# Patient Record
Sex: Male | Born: 1955 | Race: White | Hispanic: No | Marital: Single | Smoking: Current every day smoker
Health system: Southern US, Community
[De-identification: ages and names within clinical notes are randomized; demographics above are authoritative.]

## PROBLEM LIST (undated history)

## (undated) ENCOUNTER — Emergency Department (HOSPITAL_COMMUNITY): Admission: EM | Discharge status: Absent without leave | Payer: Self-pay

## (undated) DIAGNOSIS — J45909 Unspecified asthma, uncomplicated: Secondary | ICD-10-CM

## (undated) DIAGNOSIS — J449 Chronic obstructive pulmonary disease, unspecified: Secondary | ICD-10-CM

## (undated) DIAGNOSIS — I10 Essential (primary) hypertension: Secondary | ICD-10-CM

## (undated) DIAGNOSIS — I219 Acute myocardial infarction, unspecified: Secondary | ICD-10-CM

## (undated) DIAGNOSIS — E119 Type 2 diabetes mellitus without complications: Secondary | ICD-10-CM

## (undated) HISTORY — PX: NECK SURGERY: SHX720

## (undated) HISTORY — PX: BACK SURGERY: SHX140

---

## 2015-06-02 ENCOUNTER — Observation Stay (HOSPITAL_COMMUNITY)
Admission: EM | Admit: 2015-06-02 | Discharge: 2015-06-03 | Disposition: A | Discharge status: Home / Self Care | Payer: Self-pay | Attending: Emergency Medicine | Admitting: Emergency Medicine

## 2015-06-02 ENCOUNTER — Emergency Department (HOSPITAL_COMMUNITY): Payer: Self-pay

## 2015-06-02 ENCOUNTER — Other Ambulatory Visit (HOSPITAL_COMMUNITY): Payer: Self-pay

## 2015-06-02 ENCOUNTER — Encounter (HOSPITAL_COMMUNITY): Payer: Self-pay | Admitting: Emergency Medicine

## 2015-06-02 ENCOUNTER — Other Ambulatory Visit: Payer: Self-pay

## 2015-06-02 DIAGNOSIS — R112 Nausea with vomiting, unspecified: Secondary | ICD-10-CM | POA: Insufficient documentation

## 2015-06-02 DIAGNOSIS — R079 Chest pain, unspecified: Principal | ICD-10-CM | POA: Diagnosis present

## 2015-06-02 DIAGNOSIS — R001 Bradycardia, unspecified: Secondary | ICD-10-CM | POA: Insufficient documentation

## 2015-06-02 DIAGNOSIS — I10 Essential (primary) hypertension: Secondary | ICD-10-CM | POA: Diagnosis present

## 2015-06-02 DIAGNOSIS — R0789 Other chest pain: Secondary | ICD-10-CM

## 2015-06-02 DIAGNOSIS — F17200 Nicotine dependence, unspecified, uncomplicated: Secondary | ICD-10-CM | POA: Insufficient documentation

## 2015-06-02 DIAGNOSIS — E876 Hypokalemia: Secondary | ICD-10-CM | POA: Diagnosis present

## 2015-06-02 DIAGNOSIS — J441 Chronic obstructive pulmonary disease with (acute) exacerbation: Secondary | ICD-10-CM | POA: Diagnosis present

## 2015-06-02 DIAGNOSIS — R062 Wheezing: Secondary | ICD-10-CM | POA: Insufficient documentation

## 2015-06-02 HISTORY — DX: Acute myocardial infarction, unspecified: I21.9

## 2015-06-02 HISTORY — DX: Essential (primary) hypertension: I10

## 2015-06-02 LAB — CBC
HEMATOCRIT: 36.6 % — AB (ref 39.0–52.0)
Hemoglobin: 11.9 g/dL — ABNORMAL LOW (ref 13.0–17.0)
MCH: 30.7 pg (ref 26.0–34.0)
MCHC: 32.5 g/dL (ref 30.0–36.0)
MCV: 94.6 fL (ref 78.0–100.0)
PLATELETS: 288 10*3/uL (ref 150–400)
RBC: 3.87 MIL/uL — ABNORMAL LOW (ref 4.22–5.81)
RDW: 12.6 % (ref 11.5–15.5)
WBC: 8.6 10*3/uL (ref 4.0–10.5)

## 2015-06-02 LAB — I-STAT TROPONIN, ED: TROPONIN I, POC: 0.01 ng/mL (ref 0.00–0.08)

## 2015-06-02 LAB — URINALYSIS, ROUTINE W REFLEX MICROSCOPIC
Glucose, UA: NEGATIVE mg/dL
HGB URINE DIPSTICK: NEGATIVE
KETONES UR: NEGATIVE mg/dL
Leukocytes, UA: NEGATIVE
NITRITE: NEGATIVE
PROTEIN: 30 mg/dL — AB
Specific Gravity, Urine: 1.028 (ref 1.005–1.030)
pH: 5.5 (ref 5.0–8.0)

## 2015-06-02 LAB — COMPREHENSIVE METABOLIC PANEL
ALK PHOS: 70 U/L (ref 38–126)
ALT: 30 U/L (ref 17–63)
AST: 31 U/L (ref 15–41)
Albumin: 3.2 g/dL — ABNORMAL LOW (ref 3.5–5.0)
Anion gap: 11 (ref 5–15)
BUN: 12 mg/dL (ref 6–20)
CALCIUM: 8.5 mg/dL — AB (ref 8.9–10.3)
CO2: 28 mmol/L (ref 22–32)
CREATININE: 0.95 mg/dL (ref 0.61–1.24)
Chloride: 99 mmol/L — ABNORMAL LOW (ref 101–111)
GFR calc non Af Amer: >60 mL/min (ref >60)
Glucose, Bld: 121 mg/dL — ABNORMAL HIGH (ref 65–99)
Potassium: 3.4 mmol/L — ABNORMAL LOW (ref 3.5–5.1)
SODIUM: 138 mmol/L (ref 135–145)
Total Bilirubin: 0.9 mg/dL (ref 0.3–1.2)
Total Protein: 6 g/dL — ABNORMAL LOW (ref 6.5–8.1)

## 2015-06-02 LAB — APTT: APTT: 30 s (ref 24–37)

## 2015-06-02 LAB — TROPONIN I: Troponin I: <0.03 ng/mL (ref <0.031)

## 2015-06-02 LAB — BASIC METABOLIC PANEL
Anion gap: 8 (ref 5–15)
BUN: 11 mg/dL (ref 6–20)
CALCIUM: 8.7 mg/dL — AB (ref 8.9–10.3)
CO2: 25 mmol/L (ref 22–32)
CREATININE: 1 mg/dL (ref 0.61–1.24)
Chloride: 106 mmol/L (ref 101–111)
GFR calc Af Amer: >60 mL/min (ref >60)
GLUCOSE: 92 mg/dL (ref 65–99)
POTASSIUM: 3.4 mmol/L — AB (ref 3.5–5.1)
SODIUM: 139 mmol/L (ref 135–145)

## 2015-06-02 LAB — PROTIME-INR
INR: 1.11 (ref 0.00–1.49)
INR: 1.13 (ref 0.00–1.49)
Prothrombin Time: 14.5 seconds (ref 11.6–15.2)
Prothrombin Time: 14.7 seconds (ref 11.6–15.2)

## 2015-06-02 LAB — LIPID PANEL
Cholesterol: 115 mg/dL (ref 0–200)
HDL: 45 mg/dL (ref >40)
LDL Cholesterol: 62 mg/dL (ref 0–99)
TRIGLYCERIDES: 38 mg/dL (ref <150)
Total CHOL/HDL Ratio: 2.6 RATIO
VLDL: 8 mg/dL (ref 0–40)

## 2015-06-02 LAB — URINE MICROSCOPIC-ADD ON
RBC / HPF: NONE SEEN RBC/hpf (ref 0–5)
WBC, UA: NONE SEEN WBC/hpf (ref 0–5)

## 2015-06-02 LAB — RAPID URINE DRUG SCREEN, HOSP PERFORMED
Amphetamines: NOT DETECTED
Barbiturates: NOT DETECTED
Benzodiazepines: NOT DETECTED
Cocaine: NOT DETECTED
OPIATES: NOT DETECTED
Tetrahydrocannabinol: NOT DETECTED

## 2015-06-02 LAB — D-DIMER, QUANTITATIVE: D-Dimer, Quant: 0.42 ug/mL-FEU (ref 0.00–0.50)

## 2015-06-02 LAB — LIPASE, BLOOD: Lipase: 36 U/L (ref 11–51)

## 2015-06-02 LAB — MAGNESIUM: MAGNESIUM: 1.9 mg/dL (ref 1.7–2.4)

## 2015-06-02 MED ORDER — ONDANSETRON HCL 4 MG/2ML IJ SOLN: 4.0000 mg | Freq: Four times a day (QID) | INTRAMUSCULAR | Status: DC | PRN

## 2015-06-02 MED ORDER — ARFORMOTEROL TARTRATE 15 MCG/2ML IN NEBU
15.0000 ug | INHALATION_SOLUTION | Freq: Two times a day (BID) | RESPIRATORY_TRACT | Status: DC
  Administered 2015-06-02 – 2015-06-03 (×3): 15 ug via RESPIRATORY_TRACT
  Filled 2015-06-02 (×3): qty 2

## 2015-06-02 MED ORDER — ALBUTEROL SULFATE (2.5 MG/3ML) 0.083% IN NEBU: 2.5000 mg | INHALATION_SOLUTION | RESPIRATORY_TRACT | Status: DC | PRN

## 2015-06-02 MED ORDER — IPRATROPIUM-ALBUTEROL 0.5-2.5 (3) MG/3ML IN SOLN
3.0000 mL | Freq: Once | RESPIRATORY_TRACT | Status: AC
  Administered 2015-06-02: 3 mL via RESPIRATORY_TRACT
  Filled 2015-06-02: qty 3

## 2015-06-02 MED ORDER — ENSURE ENLIVE PO LIQD
237.0000 mL | Freq: Two times a day (BID) | ORAL | Status: DC
  Administered 2015-06-02: 237 mL via ORAL

## 2015-06-02 MED ORDER — PREDNISONE 20 MG PO TABS
50.0000 mg | ORAL_TABLET | Freq: Every day | ORAL | Status: DC
  Administered 2015-06-03: 50 mg via ORAL
  Filled 2015-06-02 (×2): qty 2

## 2015-06-02 MED ORDER — CETYLPYRIDINIUM CHLORIDE 0.05 % MT LIQD
7.0000 mL | Freq: Two times a day (BID) | OROMUCOSAL | Status: DC
  Administered 2015-06-02 – 2015-06-03 (×3): 7 mL via OROMUCOSAL

## 2015-06-02 MED ORDER — THIAMINE HCL 100 MG/ML IJ SOLN
Freq: Once | INTRAVENOUS | Status: AC
  Administered 2015-06-02: 10:00:00 via INTRAVENOUS
  Filled 2015-06-02: qty 1000

## 2015-06-02 MED ORDER — IPRATROPIUM-ALBUTEROL 0.5-2.5 (3) MG/3ML IN SOLN
3.0000 mL | Freq: Three times a day (TID) | RESPIRATORY_TRACT | Status: DC
  Administered 2015-06-03 (×2): 3 mL via RESPIRATORY_TRACT
  Filled 2015-06-02 (×2): qty 3

## 2015-06-02 MED ORDER — POTASSIUM CHLORIDE CRYS ER 20 MEQ PO TBCR
20.0000 meq | EXTENDED_RELEASE_TABLET | Freq: Once | ORAL | Status: DC
  Filled 2015-06-02: qty 1

## 2015-06-02 MED ORDER — THIAMINE HCL 100 MG/ML IJ SOLN: 100.0000 mg | Freq: Every day | INTRAMUSCULAR | Status: DC

## 2015-06-02 MED ORDER — IBUPROFEN 800 MG PO TABS
800.0000 mg | ORAL_TABLET | Freq: Once | ORAL | Status: AC
  Administered 2015-06-02: 800 mg via ORAL
  Filled 2015-06-02: qty 1

## 2015-06-02 MED ORDER — ACETAMINOPHEN 325 MG PO TABS
650.0000 mg | ORAL_TABLET | ORAL | Status: DC | PRN
  Administered 2015-06-02 (×2): 650 mg via ORAL
  Filled 2015-06-02 (×2): qty 2

## 2015-06-02 MED ORDER — ADULT MULTIVITAMIN W/MINERALS CH
1.0000 | ORAL_TABLET | Freq: Every day | ORAL | Status: DC
  Administered 2015-06-02 – 2015-06-03 (×2): 1 via ORAL
  Filled 2015-06-02 (×2): qty 1

## 2015-06-02 MED ORDER — IPRATROPIUM-ALBUTEROL 0.5-2.5 (3) MG/3ML IN SOLN
3.0000 mL | Freq: Four times a day (QID) | RESPIRATORY_TRACT | Status: DC
  Administered 2015-06-02 (×2): 3 mL via RESPIRATORY_TRACT
  Filled 2015-06-02 (×2): qty 3

## 2015-06-02 MED ORDER — SODIUM CHLORIDE 0.9 % IV SOLN
INTRAVENOUS | Status: DC
  Administered 2015-06-02: 22:00:00 via INTRAVENOUS
  Administered 2015-06-02: 75 mL/h via INTRAVENOUS

## 2015-06-02 MED ORDER — VITAMIN B-1 100 MG PO TABS
100.0000 mg | ORAL_TABLET | Freq: Every day | ORAL | Status: DC
  Administered 2015-06-02 – 2015-06-03 (×2): 100 mg via ORAL
  Filled 2015-06-02 (×2): qty 1

## 2015-06-02 MED ORDER — ENOXAPARIN SODIUM 40 MG/0.4ML ~~LOC~~ SOLN
40.0000 mg | SUBCUTANEOUS | Status: DC
  Administered 2015-06-02 – 2015-06-03 (×2): 40 mg via SUBCUTANEOUS
  Filled 2015-06-02 (×2): qty 0.4

## 2015-06-02 MED ORDER — NICOTINE 21 MG/24HR TD PT24
21.0000 mg | MEDICATED_PATCH | Freq: Every day | TRANSDERMAL | Status: DC
  Administered 2015-06-02 – 2015-06-03 (×2): 21 mg via TRANSDERMAL
  Filled 2015-06-02 (×2): qty 1

## 2015-06-02 MED ORDER — FOLIC ACID 1 MG PO TABS
1.0000 mg | ORAL_TABLET | Freq: Every day | ORAL | Status: DC
  Administered 2015-06-02 – 2015-06-03 (×2): 1 mg via ORAL
  Filled 2015-06-02 (×2): qty 1

## 2015-06-02 MED ORDER — LORAZEPAM 2 MG/ML IJ SOLN: 1.0000 mg | Freq: Four times a day (QID) | INTRAMUSCULAR | Status: DC | PRN

## 2015-06-02 MED ORDER — ALBUTEROL SULFATE (2.5 MG/3ML) 0.083% IN NEBU
2.5000 mg | INHALATION_SOLUTION | RESPIRATORY_TRACT | Status: DC
  Administered 2015-06-02: 2.5 mg via RESPIRATORY_TRACT
  Filled 2015-06-02: qty 3

## 2015-06-02 MED ORDER — GI COCKTAIL ~~LOC~~
30.0000 mL | Freq: Four times a day (QID) | ORAL | Status: DC | PRN
Start: 2015-06-02 — End: 2015-06-03

## 2015-06-02 MED ORDER — ENSURE ENLIVE PO LIQD
237.0000 mL | Freq: Three times a day (TID) | ORAL | Status: DC
  Administered 2015-06-02 – 2015-06-03 (×4): 237 mL via ORAL

## 2015-06-02 MED ORDER — LORAZEPAM 1 MG PO TABS
1.0000 mg | ORAL_TABLET | Freq: Four times a day (QID) | ORAL | Status: DC | PRN
  Administered 2015-06-02 – 2015-06-03 (×3): 1 mg via ORAL
  Filled 2015-06-02 (×3): qty 1

## 2015-06-02 MED ORDER — ALBUTEROL SULFATE (2.5 MG/3ML) 0.083% IN NEBU: 2.5000 mg | INHALATION_SOLUTION | Freq: Four times a day (QID) | RESPIRATORY_TRACT | Status: DC | PRN

## 2015-06-02 NOTE — Progress Notes (Signed)
Initial Nutrition Assessment  DOCUMENTATION CODES:   Underweight  INTERVENTION:  Provide Ensure Enlive po TID, each supplement provides 350 kcal and 20 grams of protein Multivitamin with minerals daily   NUTRITION DIAGNOSIS:   Increased nutrient needs related to chronic illness, other (see comment) (and underweight status) as evidenced by estimated needs.   GOAL:   Patient will meet greater than or equal to 90% of their needs   MONITOR:   Supplement acceptance, Weight trends, PO intake, Labs, Skin, I & O's  REASON FOR ASSESSMENT:   Malnutrition Screening Tool    ASSESSMENT:   60 y.o. male with a past medical history significant for HTN and reported MI and smoking and alcohol use who presents with chest pain and trouble breathing for 1 day.  Pt with acute flare up of COPD per MD note. Pt asleep at time of visit. Pt appears thin with moderate muscle wasting of clavicles. Breakfast tray at bedside with 90% consumed. IV fluids with folic acid and thiamine infusing.   Labs: low hemoglobin  Diet Order:  Diet Heart Room service appropriate?: Yes; Fluid consistency:: Thin  Skin:  Reviewed, no issues  Last BM:  5/25  Height:   Ht Readings from Last 1 Encounters:  06/02/15 5\' 10"  (1.778 m)    Weight:   Wt Readings from Last 1 Encounters:  06/02/15 125 lb 8 oz (56.926 kg)    Ideal Body Weight:  75.5 kg  BMI:  Body mass index is 18.01 kg/(m^2).  Estimated Nutritional Needs:   Kcal:  2000-2200  Protein:  85-95 grams  Fluid:  2 L/day  EDUCATION NEEDS:   No education needs identified at this time  Dorothea Ogleeanne Glayds Insco RD, LDN Inpatient Clinical Dietitian Pager: [phone] After Hours Pager: [phone]

## 2015-06-02 NOTE — Clinical Social Work Note (Signed)
CSW provided patient with one pair of pants and two shirts as well as a list of multiple types of resources in [facility]. Patient appreciative.   CSW signing off. Consult if any other social work needs arise.  Charlynn CourtSarah Susannah Carbin, CSW [phone]
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
## 2015-06-02 NOTE — Plan of Care (Signed)
Problem: Consults Goal: Chest Pain Patient Education (See Patient Education module for education specifics.) Outcome: Progressing Patient admitted with chest pain during bus travel, unrelieved with sublingual nitroglycerin; associated with nausea and SOB. Initial troponin was negative, EKG showed sinus bradycardia and LVH, CXR showed changes of COPD/emphysema; patient is a heavy, every-day smoker. Patient states he had two 40 oz beers today; does not currently appear acutely intoxicated, but exhibits delayed responses and is a poor historian. Signs of alcohol abuse with some mild withdrawal symptoms; was placed on CIWA protocol per MD on admission. Initial CIWA score = 5 (see flowsheets).  Patient states he resides in [place], but is unable to provide a street address on admission to unit; CSW consult ordered for possible homeless issues and likely alcoholism.  Chest pain continues 3/10, mid chest, non-radiating with no associated symptoms.  Vitals stable; hypertensive: Filed Vitals:    06/02/15 0300 06/02/15 0315 06/02/15 0351 06/02/15 0353  BP: 157/89 147/81   154/98  Pulse: 60 52   47  Temp:       97.5 F (36.4 C)  TempSrc:          Resp: 20 15   18   Height:       5\' 10"  (1.778 m)  Weight:       56.926 kg (125 lb 8 oz)  SpO2: 98% 100% 98% 100%    Medicated with Tylenol 650 mg PO and lorazepam 1 mg PO for pain/symptoms of mild withdrawal. Discussed initial plan of care with patient and made NPO for potential diagnostic testing.  Continuing to monitor.

## 2015-06-02 NOTE — Consult Note (Signed)
CARDIOLOGY CONSULT NOTE       Patient ID: Cody Ortiz MRN: [id number] DOB/AGE: 03-23-1955 60 y.o.  Admit date: 06/02/2015 Referring Physician:  Mahala MenghiniSamtani Primary Physician: No primary care provider on file. Primary Cardiologist:  New Reason for Consultation: Chest Pain   Principal Problem:   COPD exacerbation (HCC) Active Problems:   Chest pain   Essential hypertension   Hypokalemia   HPI:  60 y.o. admitted with COPD flair and atypical chest pain The patient is a poor historian, appears to have dementia, but it seems that [place], stayed in a house with two other people where he was breathing in something that irritated his lungs, and developed pleuritic chest pain and shortness of breath and new cough. The center of his chest, pressure-like, worse with taking a breath or coughing, not associated with productive cough or hemoptysis. The pain persisted the entire drive from [place] to [place] on the bus, and when he got here he came straight to the ER via EMS. Evidently, he told EMS that the pain started at midnight and was associated with vomiting, no SOB.  [facility] Has r/o and ECG is normal Echo has been ordered and pending.    ROS All other systems reviewed and negative except as noted above  Past Medical History  Diagnosis Date  . Hypertension   . Myocardial infarction [facility](HCC)     Family History  Problem Relation Age of Onset  . Heart attack Father 1960  . Stroke Father     Social History   Social History  . Marital Status: Single    Spouse Name: N/A  . Number of Children: N/A  . Years of Education: N/A   Occupational History  . Not on file.   Social History Main Topics  . Smoking status: Current Every Day Smoker  . Smokeless tobacco: Not on file  . Alcohol Use: Yes  . Drug Use: Not on file  . Sexual Activity: Not on file   Other Topics Concern  . Not on file   Social History Narrative  . No narrative on  file    Past Surgical History  Procedure Laterality Date  . Back surgery       . antiseptic oral rinse  7 mL Mouth Rinse BID  . enoxaparin (LOVENOX) injection  40 mg Subcutaneous Q24H  . feeding supplement (ENSURE ENLIVE)  237 mL Oral BID BM  . folic acid  1 mg Oral Daily  . multivitamin with minerals  1 tablet Oral Daily  . potassium chloride  20 mEq Oral Once  . predniSONE  50 mg Oral Q breakfast  . thiamine  100 mg Oral Daily   Or  . thiamine  100 mg Intravenous Daily      Physical Exam: Blood pressure 154/98, pulse 47, temperature 97.5 F (36.4 C), temperature source Oral, resp. rate 18, height 5\' 10"  (1.778 m), weight 56.926 kg (125 lb 8 oz), SpO2 100 %.    Lethargic  Disheveled white male  HEENT: normal Neck supple with no adenopathy JVP normal no bruits no thyromegaly Lungs clear with exp wheezing and good diaphragmatic motion Heart:  S1/S2 no murmur, no rub, gallop or click PMI normal Abdomen: benighn, BS positve, no tenderness, no AAA no bruit.  No HSM or HJR Distal pulses intact with no bruits No edema Neuro non-focal Skin warm and dry No muscular weakness   Labs:   Lab Results  Component Value  Date   WBC 8.6 06/02/2015   HGB 11.9* 06/02/2015   HCT 36.6* 06/02/2015   MCV 94.6 06/02/2015   PLT 288 06/02/2015     Recent Labs Lab 06/02/15 0050  NA 139  K 3.4*  CL 106  CO2 25  BUN 11  CREATININE 1.00  CALCIUM 8.7*  GLUCOSE 92   Lab Results  Component Value Date   TROPONINI <0.03 06/02/2015    Lab Results  Component Value Date   CHOL 115 06/02/2015   Lab Results  Component Value Date   HDL 45 06/02/2015   Lab Results  Component Value Date   LDLCALC 62 06/02/2015   Lab Results  Component Value Date   TRIG 38 06/02/2015   Lab Results  Component Value Date   CHOLHDL 2.6 06/02/2015   No results found for: LDLDIRECT    Radiology: Dg Chest 2 View  06/02/2015  CLINICAL DATA:  Chest pain for 1 hour. EXAM: CHEST  2 VIEW  COMPARISON:  None. FINDINGS: The heart is normal in size, there is tortuosity of thoracic aorta. The lungs are hyperinflated. Pulmonary vasculature is normal. No consolidation, pleural effusion, or pneumothorax. Biapical pleural parenchymal scarring. No acute osseous abnormalities are seen. Remote left rib fracture. IMPRESSION: Hyperinflation, suggesting emphysema. Biapical pleural parenchymal scarring. No acute process. Electronically Signed   By: Rubye Oaks M.D.   On: 06/02/2015 01:37    Current facility-administered medications:  .  acetaminophen (TYLENOL) tablet 650 mg, 650 mg, Oral, Q4H PRN, Alberteen Sam, MD, 650 mg at 06/02/15 0354 .  albuterol (PROVENTIL) (2.5 MG/3ML) 0.083% nebulizer solution 2.5 mg, 2.5 mg, Nebulization, Q2H PRN, Alberteen Sam, MD .  antiseptic oral rinse (CPC / CETYLPYRIDINIUM CHLORIDE 0.05%) solution 7 mL, 7 mL, Mouth Rinse, BID, Rhetta Mura, MD .  enoxaparin (LOVENOX) injection 40 mg, 40 mg, Subcutaneous, Q24H, Alberteen Sam, MD .  feeding supplement (ENSURE ENLIVE) (ENSURE ENLIVE) liquid 237 mL, 237 mL, Oral, BID BM, Alberteen Sam, MD .  folic acid (FOLVITE) tablet 1 mg, 1 mg, Oral, Daily, Alberteen Sam, MD .  gi cocktail (Maalox,Lidocaine,Donnatal), 30 mL, Oral, QID PRN, Alberteen Sam, MD .  LORazepam (ATIVAN) tablet 1 mg, 1 mg, Oral, Q6H PRN, 1 mg at 06/02/15 0400 **OR** LORazepam (ATIVAN) injection 1 mg, 1 mg, Intravenous, Q6H PRN, Alberteen Sam, MD .  multivitamin with minerals tablet 1 tablet, 1 tablet, Oral, Daily, Alberteen Sam, MD .  ondansetron (ZOFRAN) injection 4 mg, 4 mg, Intravenous, Q6H PRN, Alberteen Sam, MD .  potassium chloride SA (K-DUR,KLOR-CON) CR tablet 20 mEq, 20 mEq, Oral, Once, Alberteen Sam, MD .  predniSONE (DELTASONE) tablet 50 mg, 50 mg, Oral, Q breakfast, Alberteen Sam, MD .  thiamine (VITAMIN B-1) tablet 100 mg, 100 mg, Oral, Daily **OR**  thiamine (B-1) injection 100 mg, 100 mg, Intravenous, Daily, Alberteen Sam, MD  EKG:  NSR normal ECG    ASSESSMENT AND PLAN:  Chest Pain:  Atypical in setting of COPD flair r/o and normal ECG Favor echo today and if no RWMA and remains pain free d/c in am If pain continues can consider ETT as his ECG is normal COPD:  Everyday smoker CXR with emphysema no penumonia doubt PE.  Continue steroids and inhalers  Dementia:  Not clear what living situation is like needs outpatient primary care f/u   Signed: Charlton Haws 06/02/2015, 8:08 AM

## 2015-06-02 NOTE — ED Provider Notes (Signed)
CSN: [id number]     Arrival date & time 06/02/15  0036 History   First MD Initiated Contact with Patient 06/02/15 [phone]     Chief Complaint  Patient presents with  . Chest Pain     (Consider location/radiation/quality/duration/timing/severity/associated sxs/prior Treatment) HPI   Cody Ortiz is a 60 y.o. male, with a history of Hypertension, presenting to the ED with chest pain that began suddenly about midnight this morning. Patient states he was [place] when he began to have central chest pain, 10 out of 10, describes as a tightness/squeezing, radiating to the left chest. Patient also endorses shortness of breath and nausea with 2 episodes of vomiting. Patient states he does not know what medications he is taking, but states he does not think he has any heart or lung problems. Endorses drinking 40 ounces of beer within the last 6 hours. Denies illicit drug use. Denies fever/chills, trauma, LOC, or any other complaints.    Past Medical History  Diagnosis Date  . Hypertension    No past surgical history on file. No family history on file. Social History  Substance Use Topics  . Smoking status: Current Every Day Smoker  . Smokeless tobacco: None  . Alcohol Use: None    Review of Systems  Constitutional: Positive for appetite change and unexpected weight change (About 55 lbs in last 3-6 months). Negative for fever, chills and diaphoresis.  Respiratory: Positive for shortness of breath. Negative for cough.   Cardiovascular: Positive for chest pain. Negative for palpitations and leg swelling.  Gastrointestinal: Positive for nausea and vomiting. Negative for abdominal pain, diarrhea and constipation.  Genitourinary: Negative for dysuria and flank pain.  Musculoskeletal: Negative for back pain.  Skin: Negative for color change and pallor.  Neurological: Negative for dizziness, syncope, weakness and light-headedness.  All other systems reviewed and  are negative.     Allergies  Review of patient's allergies indicates no known allergies.  Home Medications   Prior to Admission medications   Not on File   BP 137/80 mmHg  Pulse 55  Temp(Src) 97.7 F (36.5 C) (Oral)  Resp 16  SpO2 97% Physical Exam  Constitutional: He appears well-developed and well-nourished. No distress.  HENT:  Head: Normocephalic and atraumatic.  Eyes: Conjunctivae are normal. Pupils are equal, round, and reactive to light.  Neck: Neck supple.  Cardiovascular: Regular rhythm, normal heart sounds and intact distal pulses.  Bradycardia present.   Pulmonary/Chest: Effort normal. No tachypnea. He has wheezes in the right middle field, the right lower field, the left upper field, the left middle field and the left lower field. He exhibits tenderness.  Abdominal: Soft. There is no tenderness. There is no guarding.  Musculoskeletal: He exhibits no edema or tenderness.  Lymphadenopathy:    He has no cervical adenopathy.  Neurological: He is alert.  Skin: Skin is warm and dry. He is not diaphoretic.  Psychiatric: He has a normal mood and affect. His behavior is normal.  Nursing note and vitals reviewed.   ED Course  Procedures (including critical care time) Labs Review Labs Reviewed  BASIC METABOLIC PANEL - Abnormal; Notable for the following:    Potassium 3.4 (*)    Calcium 8.7 (*)    All other components within normal limits  CBC - Abnormal; Notable for the following:    RBC 3.87 (*)    Hemoglobin 11.9 (*)    HCT 36.6 (*)    All other components within normal  limits  URINE CULTURE  APTT  PROTIME-INR  D-DIMER, QUANTITATIVE (NOT AT [facility])  URINALYSIS, ROUTINE W REFLEX MICROSCOPIC (NOT AT [facility])  URINE RAPID DRUG SCREEN, HOSP PERFORMED  I-STAT TROPOININ, ED    Imaging Review Dg Chest 2 View  06/02/2015  CLINICAL DATA:  Chest pain for 1 hour. EXAM: CHEST  2 VIEW COMPARISON:  None. FINDINGS: The heart is normal in size, there is tortuosity of  thoracic aorta. The lungs are hyperinflated. Pulmonary vasculature is normal. No consolidation, pleural effusion, or pneumothorax. Biapical pleural parenchymal scarring. No acute osseous abnormalities are seen. Remote left rib fracture. IMPRESSION: Hyperinflation, suggesting emphysema. Biapical pleural parenchymal scarring. No acute process. Electronically Signed   By: Rubye Oaks M.D.   On: 06/02/2015 01:37   I have personally reviewed and evaluated these images and lab results as part of my medical decision-making.   EKG Interpretation   Date/Time:  Saturday Jun 02 2015 00:47:08 EDT Ventricular Rate:  46 PR Interval:  150 QRS Duration: 97 QT Interval:  499 QTC Calculation: 436 R Axis:   70 Text Interpretation:  Sinus bradycardia Left ventricular hypertrophy No  acute changes No significant change since last tracing Confirmed by  Rhunette Croft, MD, Janey Genta [phone]) on 06/02/2015 1:09:39 AM      MDM   Final diagnoses:  Chest pain, unspecified chest pain type    Cody Jenkins presents with sudden onset of chest pain that began about an hour prior to arrival.  Findings and plan of care discussed with Derwood Kaplan, MD.  Presentation suspicous for ACS. HEART score is 4, indicating moderate risk for a cardiac event. Wells criteria score is 0, indicating low risk for PE. Patient is nontoxic appearing, afebrile, not tachycardic, not tachypneic, maintains adequate SPO2 on room air, and is in no apparent distress. Patient has no signs of sepsis. Narcotics such as morphine initially withheld due to the patient's bradycardia. Initial troponin negative. Admission for chest pain observation. D-dimer due to patient's low Wells score, but suspicious presentation. D-dimer negative.  2:40 AM Spoke with Dr. Maryfrances Bunnell, Hospitalist, who agreed to admit the patient to telemetry observation.   Filed Vitals:   06/02/15 0037 06/02/15 0100 06/02/15 0145  BP: 137/80 154/89 156/87  Pulse: 55 48 49  Temp:  97.7 F (36.5 C)    TempSrc: Oral    Resp: SpO2: 97% 99% 100%   Filed Vitals:   06/02/15 0100 06/02/15 0145 06/02/15 0200 06/02/15 0215  BP: 154/89 156/87 159/92 141/90  Pulse: 48 49 56 58  Temp:      TempSrc:      Resp: SpO2: 99% 100% 100% 99%      Anselm Pancoast, PA-C 06/02/15 0443  Gilda Crease, MD 06/02/15 [phone]

## 2015-06-02 NOTE — H&P (Signed)
History and Physical  Patient Name: Cody JenkinsClifford Maranto     WJX:[id number]    DOB: [birth year]    DOA: 06/02/2015 PCP: No primary care provider on file.   Patient coming from: Bus station  Chief Complaint: Pleuritic chest pain  HPI: Cody Ortiz is a 60 y.o. male with a past medical history significant for HTN and reported MI and smoking and alcohol use who presents with chest pain and trouble breathing for 1 day.  The patient is a poor historian, appears to have dementia, but it seems that [place], stayed in a house with two other people where he was breathing in something that irritated his lungs, and today developed pleuritic chest pain and shortness of breath and new cough.  The center of his chest, pressure-like, worse with taking a breath or coughing, not associated with productive cough or hemoptysis.  The pain persisted the entire drive from [place] to [place] on the bus, and when he got here he came straight to the ER via EMS.  Evidently, he told EMS that the pain started at midnight and was associated with vomiting, no SOB.  ED course: -Afebrile, bradycardic, mild hypertension, saturating well on room air -Na 139, K 3.4, Cr 1.0 (no baseline), WBC 8.6K, Hgb 11.9, INR normal, troponin negative -CXR showed emphsema without focal opacity and with tortuous but not dilated aorta -ECG showed sinus bradycardia without ST changes -He was given aspirin 325 and nitroglycerin, which didn't improve the pain -he was wheezing and so he was given Duoneb which helped  Reports that he had an MI in [place] 1 year ago, doesn't know what hospital or where.  Doesn't think he had a stent or CABG.  Smokes now.  Father had MI at age 61.  Patient may have hypertension, does not see a doctor now, since [place].  Has two children from whom he is estranged, no other living family.   Review of Systems:  Pt complains of pleuritic chest pain, shortness of breath,   pain all over, rib pain, wheezing, cough, vomiting, dyspnea on exertion progressive over months, weight loss over months. Pt denies any fever, yellow sputum, hemoptysis, leg swelling, orthopnea.  All other systems negative except as just noted or noted in the history of present illness.    Past Medical History  Diagnosis Date  . Hypertension   . Myocardial infarction [facility](HCC)     Past Surgical History  Procedure Laterality Date  . Back surgery      Social History: Patient lives alone in a rented room. He is on disability.  The patient walks without assistance.  He smokes.  He last drank yesterday, beer.  He has had withdrawal in the past.  Has a son in [place] who is a Emergency planning/management officerpolice officer and daughter somewhere from whom he is estranged.  No Known Allergies  Family history: family history includes Heart attack (age of onset: 8060) in his father; Stroke in his father.  Prior to Admission medications   Not on File       Physical Exam: BP 147/81 mmHg  Pulse 52  Temp(Src) 97.7 F (36.5 C) (Oral)  Resp 15  SpO2 100% General appearance: Thin frail adult male, sleeping when I enter, rousable, verbal and in no distress but moderate discomfort from pain.    Eyes: Anicteric, conjunctiva pink, lids and lashes normal.     ENT: No nasal deformity, discharge, or epistaxis.  OP moist  without lesions.  Edentulous. Skin: Warm and dry.  No jaundice.  No suspicious rashes or lesions. Cardiac: RRR, nl S1-S2, no murmurs appreciated.   JVP normal.  No LE edema.  Radial pulses 2+ and symmetric. Respiratory: Normal respiratory rate and rhythm.  Coarse breath sounds throughout and expiratory wheezes, faint. Abdomen: Abdomen without ascites, distension.  Guarding with exam, tender throughout, nonfocal. MSK: No deformities or effusions. Neuro: Memory poor.  Oriented to Lars Pinks, year, but memory poor for details.  Speech is dysarthric but appears at baseline.  Moves all extremities equally and with  normal coordination.    Psych: Affect morose.  No evidence of aural or visual hallucinations or delusions.   No tremor or diaphoresis.    Labs on Admission:  I have personally reviewed following labs and imaging studies: CBC:  Recent Labs Lab 06/02/15 0050  WBC 8.6  HGB 11.9*  HCT 36.6*  MCV 94.6  PLT 288   Basic Metabolic Panel:  Recent Labs Lab 06/02/15 0050  NA 139  K 3.4*  CL 106  CO2 25  GLUCOSE 92  BUN 11  CREATININE 1.00  CALCIUM 8.7*   GFR: CrCl cannot be calculated (Unknown ideal weight.). Liver Function Tests: No results for input(s): AST, ALT, ALKPHOS, BILITOT, PROT, ALBUMIN in the last 168 hours. No results for input(s): LIPASE, AMYLASE in the last 168 hours. No results for input(s): AMMONIA in the last 168 hours. Coagulation Profile:  Recent Labs Lab 06/02/15 0050  INR 1.11   Cardiac Enzymes: No results for input(s): CKTOTAL, CKMB, CKMBINDEX, TROPONINI in the last 168 hours. BNP (last 3 results) No results for input(s): PROBNP in the last 8760 hours. HbA1C: No results for input(s): HGBA1C in the last 72 hours. CBG: No results for input(s): GLUCAP in the last 168 hours. Lipid Profile: No results for input(s): CHOL, HDL, LDLCALC, TRIG, CHOLHDL, LDLDIRECT in the last 72 hours. Thyroid Function Tests: No results for input(s): TSH, T4TOTAL, FREET4, T3FREE, THYROIDAB in the last 72 hours. Anemia Panel: No results for input(s): VITAMINB12, FOLATE, FERRITIN, TIBC, IRON, RETICCTPCT in the last 72 hours. Sepsis Labs: @LABRCNTIP (procalcitonin:4,lacticidven:4) )No results found for this or any previous visit (from the past 240 hour(s)).       Radiological Exams on Admission: Personally reviewed: Dg Chest 2 View  06/02/2015  CLINICAL DATA:  Chest pain for 1 hour. EXAM: CHEST  2 VIEW COMPARISON:  None. FINDINGS: The heart is normal in size, there is tortuosity of thoracic aorta. The lungs are hyperinflated. Pulmonary vasculature is normal. No  consolidation, pleural effusion, or pneumothorax. Biapical pleural parenchymal scarring. No acute osseous abnormalities are seen. Remote left rib fracture. IMPRESSION: Hyperinflation, suggesting emphysema. Biapical pleural parenchymal scarring. No acute process. Electronically Signed   By: Rubye Oaks M.D.   On: 06/02/2015 01:37    EKG: Independently reviewed. Rate 46, QTc 436, no ST changes.    Assessment/Plan 1. Chest pain:  Heart score 3, medium risk.  Chest pain is atypical.  Wells score low and PE appears ruled out by d-dimer.  Dissection doubted clinically and by CXR. Given patient dementia and lack of social support, he would likely not be a good candidate for PCI were he to have a stress test for his chest pain.   -Cycle troponins -Echocardiogram ordered -Consult to Cardiology for risk stratification   2. COPD flare:  Patient is a poor historian and it may be that his chest pain is all related to his COPD flare. -Prednisone 50 mg  for 5 days -Albuterol scheduled and PRN -Consult to CM for assistance with affording prednisone at discharge and possibly attaining nebulizer  3. HTN:  Untreated.  Not currently established with PCP.  4. Hypokalemia:  -Check magnesium -Replace      DVT prophylaxis: Lovenox  Code Status: FULL  Family Communication: None  Disposition Plan: Anticipate observation overnight, bronchodilators and start prednisone. Tomorrow, echocardiogram and cycle troponins.  If normal, discharge to home. Consults called: None Admission status: Observation, telemetry   Medical decision making: Patient seen at 3:00 AM on 06/02/2015.  The patient was discussed with Harolyn Rutherford, PA-C.   Clinical condition: stable and comfortable appearing at the time of my exam.        Alberteen Sam [facility] Pager [phone]

## 2015-06-02 NOTE — ED Notes (Signed)
MD at bedside. 

## 2015-06-03 ENCOUNTER — Observation Stay (HOSPITAL_BASED_OUTPATIENT_CLINIC_OR_DEPARTMENT_OTHER): Payer: Self-pay

## 2015-06-03 DIAGNOSIS — R079 Chest pain, unspecified: Secondary | ICD-10-CM

## 2015-06-03 DIAGNOSIS — I77810 Thoracic aortic ectasia: Secondary | ICD-10-CM

## 2015-06-03 LAB — ECHOCARDIOGRAM COMPLETE
Height: 70 in
Weight: 2100.8 oz

## 2015-06-03 LAB — URINE CULTURE

## 2015-06-03 MED ORDER — PREDNISONE 50 MG PO TABS: 50.0000 mg | ORAL_TABLET | Freq: Every day | ORAL | Status: DC

## 2015-06-03 MED ORDER — ALBUTEROL SULFATE HFA 108 (90 BASE) MCG/ACT IN AERS: 2.0000 | INHALATION_SPRAY | Freq: Four times a day (QID) | RESPIRATORY_TRACT | Status: AC | PRN

## 2015-06-03 NOTE — Progress Notes (Addendum)
Update: Cody Ortiz has not called back and will not answer phone. CSW provided nurse with taxi voucher to [facility]. CSW signing off.   CSW consulted regarding transportation. Patient provided contact: [name] [phone] or [phone]). CSW spoke with [name]. She stated patient is a family friend and that she would call and ask her mom if she could come pick patient up. CSW to follow up.   [name] LCSWA [phone]

## 2015-06-03 NOTE — Progress Notes (Signed)
Echocardiogram 2D Echocardiogram has been performed.  Cody Ortiz 06/03/2015, 9:48 AM

## 2015-06-03 NOTE — Progress Notes (Addendum)
Pt discharge paperwork gone over with patient in detail. All questions answered to patient satisfaction. CM got patient his prednisone and his inhaler to take home with him as well as a cab voucher to shelter and a new walker. Pt educated on the importance of his follow up visits. Pt demonstrates understanding. Pt educated on his medications and how to take them. Patient demonstrates understanding with teach back. Patient discharged to shelter buy way of taxi.

## 2015-06-03 NOTE — Care Management Note (Signed)
Case Management Note  Patient Details  Name: Cody Ortiz MRN: [id number] Date of Birth: [birth year]  Subjective/Objective:    COPD exac, chest pain                Action/Plan: Discharge Planning: AVS reviewed: Contacted [facility] and pt has not received any DME.  Expected Discharge Date:                  Expected Discharge Plan:  Home/Self Care  In-House Referral:  NA  Discharge planning Services  CM Consult, Medication Assistance  Post Acute Care Choice:  NA Choice offered to:  NA  DME Arranged:  Gilmer Morane DME Agency:  [facility]  HH Arranged:  NA HH Agency:  NA  Status of Service:  Completed, signed off  Medicare Important Message Given:    Date Medicare IM Given:    Medicare IM give by:    Date Additional Medicare IM Given:    Additional Medicare Important Message give by:     If discussed at Long Length of Stay Meetings, dates discussed:    Additional Comments:  [name], [name], RN 06/03/2015, 10:48 AM

## 2015-06-03 NOTE — Progress Notes (Signed)
Consulting cardiologist: Dr. Charlton HawsPeter Nishan  Seen for followup: Chest pain  Subjective:    Still short of breath with intermittent coughing. No active chest pain.  Objective:   Temp:  [97.7 F (36.5 C)-98.9 F (37.2 C)] 98.9 F (37.2 C) (05/28 0500) Pulse Rate:  [66-82] 66 (05/28 0500) Resp:  [16-18] 18 (05/28 0500) BP: (126-163)/(52-80) 126/52 mmHg (05/28 0500) SpO2:  [98 %-100 %] 98 % (05/28 0856) Weight:  [131 lb 4.8 oz (59.557 kg)] 131 lb 4.8 oz (59.557 kg) (05/28 0500) Last BM Date: 05/31/15  Filed Weights   06/02/15 0353 06/03/15 0500  Weight: 125 lb 8 oz (56.926 kg) 131 lb 4.8 oz (59.557 kg)    Intake/Output Summary (Last 24 hours) at 06/03/15 1212 Last data filed at 06/03/15 1050  Gross per 24 hour  Intake 2882.5 ml  Output   1400 ml  Net 1482.5 ml    Telemetry: Sinus rhythm.  Exam:  General: Chronically ill-appearing male in no distress.  Lungs: Decreased breath sounds with prolonged expiratory phase.  Cardiac: RRR without gallop.  Extremities: No pitting edema.  Lab Results:  Basic Metabolic Panel:  Recent Labs Lab 06/02/15 0050 06/02/15 0517 06/02/15 1222  NA 139  --  138  K 3.4*  --  3.4*  CL 106  --  99*  CO2 25  --  28  GLUCOSE 92  --  121*  BUN 11  --  12  CREATININE 1.00  --  0.95  CALCIUM 8.7*  --  8.5*  MG  --  1.9  --     Liver Function Tests:  Recent Labs Lab 06/02/15 1222  AST 31  ALT 30  ALKPHOS 70  BILITOT 0.9  PROT 6.0*  ALBUMIN 3.2*    CBC:  Recent Labs Lab 06/02/15 0050  WBC 8.6  HGB 11.9*  HCT 36.6*  MCV 94.6  PLT 288    Cardiac Enzymes:  Recent Labs Lab 06/02/15 0517 06/02/15 0651 06/02/15 1118  TROPONINI <0.03 <0.03 <0.03   Echocardiogram 06/03/2015: Study Conclusions  - Left ventricle: The cavity size was normal. Wall thickness was  normal. Systolic function was vigorous. The estimated ejection  fraction was in the range of 65% to 70%. Incidentally noted  transverse false  tendon in LV. Wall motion was normal; there were  no regional wall motion abnormalities. Left ventricular diastolic  function parameters were normal for the patient&'s age. - Aorta: Aortic root dimension: 44 mm (ED). Ascending aortic  diameter: 45 mm (S). - Aortic root: The aortic root was moderately dilated. - Right ventricle: The cavity size was mildly dilated. - Right atrium: The atrium was mildly dilated. Central venous  pressure (est): 15 mm Hg. - Atrial septum: No defect or patent foramen ovale was identified. - Tricuspid valve: There was trivial regurgitation. - Pericardium, extracardiac: There was no pericardial effusion.  Impressions:  - Normal LV wall thickness with LVEF 65-70%, no focal wall motion  abnormalities. Grossly normal diastolic function. 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 Mild RV  enlargement with normal contraction. Mildly dilated right atrium  with elevated CVP. Trivial tricuspid regurgitation.  Medications:   Scheduled Medications: . antiseptic oral rinse  7 mL Mouth Rinse BID  . arformoterol  15 mcg Nebulization BID  . enoxaparin (LOVENOX) injection  40 mg Subcutaneous Q24H  . feeding supplement (ENSURE ENLIVE)  237 mL Oral TID BM  . folic acid  1 mg Oral Daily  . ipratropium-albuterol  3 mL Nebulization TID  . multivitamin with minerals  1 tablet Oral Daily  . nicotine  21 mg Transdermal Daily  . potassium chloride  20 mEq Oral Once  . predniSONE  50 mg Oral Q breakfast  . thiamine  100 mg Oral Daily     Infusions: . sodium chloride 75 mL/hr at 06/02/15 2200     PRN Medications:  acetaminophen, albuterol, gi cocktail, LORazepam **OR** LORazepam, ondansetron (ZOFRAN) IV   Assessment:   1. Atypical chest pain in the setting of COPD exacerbation. Troponin I levels negative. Follow-up echocardiogram shows normal LVEF without focal wall motion abnormalities.  2. 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 Suggest chest CTA for further definition of aortic anatomy, although at this point doubt dissection.  3. COPD exacerbation, currently being managed by primary team.   Plan/Discussion:    Do not anticipate ischemic testing at this time. Suggest chest CTA for further definition of aortic anatomy.   Jonelle Sidle, M.D., F.A.C.C.

## 2015-06-03 NOTE — Discharge Summary (Signed)
Physician Discharge Summary  Ephriam JenkinsClifford Lebleu ZOX:[id number] DOB: [birth year] DOA: 06/02/2015  PCP: No primary care provider on file.  Admit date: 06/02/2015 Discharge date: 06/03/2015  Time spent: 45 minutes  Recommendations for Outpatient Follow-up:  1. Will need to establish care with a primary provider 2. Will need CT chest in 1 month to determine anatomy of aortic root  Discharge Diagnoses:  Principal Problem:   COPD exacerbation (HCC) Active Problems:   Chest pain   Essential hypertension   Hypokalemia   Discharge Condition: gaurded  Diet recommendation: regular  Filed Weights   06/02/15 0353 06/03/15 0500  Weight: 56.926 kg (125 lb 8 oz) 59.557 kg (131 lb 4.8 oz)    History of present illness:   Patient was apparently dropped off by "a black man" [facility] [place] He was statying with a friend there He is homeless as of 3 weeks ago His sister recently passed He was admitted with wheeze and SOB and reported CP He is currently CP free ECHO showed consideration for Aortic dilatation but patient was CP free at time of re-evaluation  He had neg troponin x3.  He was stabilized for d/c home on 06/03/15 and Child psychotherapistsocial worker and CM are assisting with d/c planning    Discharge Exam: Filed Vitals:   06/02/15 2150 06/03/15 0500  BP: 161/77 126/52  Pulse: 82 66  Temp: 97.7 F (36.5 C) 98.9 F (37.2 C)  Resp: 16 18    General: eomi,ncat  Cardiovascular: s1 s2 no m/r/g Respiratory:slighlty wheezy  Discharge Instructions   Discharge Instructions    Diet - low sodium heart healthy    Complete by:  As directed      Increase activity slowly    Complete by:  As directed           Current Discharge Medication List    START taking these medications   Details  albuterol (PROVENTIL HFA;VENTOLIN HFA) 108 (90 Base) MCG/ACT inhaler Inhale 2 puffs into the lungs every 6 (six) hours as needed for wheezing or  shortness of breath. Qty: 1 Inhaler, Refills: 0    predniSONE (DELTASONE) 50 MG tablet Take 1 tablet (50 mg total) by mouth daily with breakfast. Qty: 4 tablet, Refills: 0       No Known Allergies Follow-up Information    Follow up with [facility].   Specialty:  Internal Medicine   Why:  please call to arrange appointment   Contact information:   [address] [place] [id number] [phone]       The results of significant diagnostics from this hospitalization (including imaging, microbiology, ancillary and laboratory) are listed below for reference.    Significant Diagnostic Studies: Dg Chest 2 View  06/02/2015  CLINICAL DATA:  Chest pain for 1 hour. EXAM: CHEST  2 VIEW COMPARISON:  None. FINDINGS: The heart is normal in size, there is tortuosity of thoracic aorta. The lungs are hyperinflated. Pulmonary vasculature is normal. No consolidation, pleural effusion, or pneumothorax. Biapical pleural parenchymal scarring. No acute osseous abnormalities are seen. Remote left rib fracture. IMPRESSION: Hyperinflation, suggesting emphysema. Biapical pleural parenchymal scarring. No acute process. Electronically Signed   By: Rubye OaksMelanie  Ehinger M.D.   On: 06/02/2015 01:37    Microbiology: Recent Results (from the past 240 hour(s))  Urine culture     Status: Abnormal   Collection Time: 06/02/15  2:46 AM  Result Value Ref  Range Status   Specimen Description URINE, CLEAN CATCH  Final   Special Requests NONE  Final   Culture MULTIPLE SPECIES PRESENT, SUGGEST RECOLLECTION (A)  Final   Report Status 06/03/2015 FINAL  Final     Labs: Basic Metabolic Panel:  Recent Labs Lab 06/02/15 0050 06/02/15 0517 06/02/15 1222  NA 139  --  138  K 3.4*  --  3.4*  CL 106  --  99*  CO2 25  --  28  GLUCOSE 92  --  121*  BUN 11  --  12  CREATININE 1.00  --  0.95  CALCIUM 8.7*  --  8.5*  MG  --  1.9  --    Liver Function Tests:  Recent Labs Lab  06/02/15 1222  AST 31  ALT 30  ALKPHOS 70  BILITOT 0.9  PROT 6.0*  ALBUMIN 3.2*    Recent Labs Lab 06/02/15 1222  LIPASE 36   No results for input(s): AMMONIA in the last 168 hours. CBC:  Recent Labs Lab 06/02/15 0050  WBC 8.6  HGB 11.9*  HCT 36.6*  MCV 94.6  PLT 288   Cardiac Enzymes:  Recent Labs Lab 06/02/15 0517 06/02/15 0651 06/02/15 1118  TROPONINI <0.03 <0.03 <0.03   BNP: BNP (last 3 results) No results for input(s): BNP in the last 8760 hours.  ProBNP (last 3 results) No results for input(s): PROBNP in the last 8760 hours.  CBG: No results for input(s): GLUCAP in the last 168 hours.     SignedRhetta Mura MD   [facility] 06/03/2015, 12:31 PM

## 2015-06-03 NOTE — Progress Notes (Signed)
The client woke up at shift change this morning set off his bed alarm and started getting dressed. He was a little confused about wanting and thinking it was time to go home but easily redirected. He was able to answer all name, date, year, president, and place questions correctly at that time. The day shift nurse and I got him back to bed and in his gown. I gave one PO ativan for his nerves and he is calmly eating breakfast at this time. I will update the day shift nurse who is with other client and she will take over the clients care.

## 2015-11-24 ENCOUNTER — Emergency Department (HOSPITAL_COMMUNITY)
Admission: EM | Admit: 2015-11-24 | Discharge: 2015-11-25 | Disposition: A | Discharge status: Home / Self Care | Payer: Medicaid - Out of State | Attending: Emergency Medicine | Admitting: Emergency Medicine

## 2015-11-24 ENCOUNTER — Emergency Department (HOSPITAL_COMMUNITY): Payer: Medicaid - Out of State

## 2015-11-24 ENCOUNTER — Encounter (HOSPITAL_COMMUNITY): Payer: Self-pay

## 2015-11-24 DIAGNOSIS — F17200 Nicotine dependence, unspecified, uncomplicated: Secondary | ICD-10-CM | POA: Diagnosis not present

## 2015-11-24 DIAGNOSIS — R0789 Other chest pain: Secondary | ICD-10-CM | POA: Insufficient documentation

## 2015-11-24 DIAGNOSIS — I10 Essential (primary) hypertension: Secondary | ICD-10-CM | POA: Insufficient documentation

## 2015-11-24 DIAGNOSIS — Z7982 Long term (current) use of aspirin: Secondary | ICD-10-CM | POA: Diagnosis not present

## 2015-11-24 DIAGNOSIS — Z609 Problem related to social environment, unspecified: Secondary | ICD-10-CM | POA: Insufficient documentation

## 2015-11-24 DIAGNOSIS — J449 Chronic obstructive pulmonary disease, unspecified: Secondary | ICD-10-CM | POA: Insufficient documentation

## 2015-11-24 DIAGNOSIS — E119 Type 2 diabetes mellitus without complications: Secondary | ICD-10-CM | POA: Diagnosis not present

## 2015-11-24 DIAGNOSIS — Z659 Problem related to unspecified psychosocial circumstances: Secondary | ICD-10-CM

## 2015-11-24 HISTORY — DX: Chronic obstructive pulmonary disease, unspecified: J44.9

## 2015-11-24 HISTORY — DX: Unspecified asthma, uncomplicated: J45.909

## 2015-11-24 HISTORY — DX: Type 2 diabetes mellitus without complications: E11.9

## 2015-11-24 LAB — BASIC METABOLIC PANEL
Anion gap: 10 (ref 5–15)
BUN: 10 mg/dL (ref 6–20)
CO2: 24 mmol/L (ref 22–32)
CREATININE: 0.75 mg/dL (ref 0.61–1.24)
Calcium: 9.3 mg/dL (ref 8.9–10.3)
Chloride: 106 mmol/L (ref 101–111)
GFR calc Af Amer: >60 mL/min (ref >60)
GLUCOSE: 95 mg/dL (ref 65–99)
POTASSIUM: 3.4 mmol/L — AB (ref 3.5–5.1)
Sodium: 140 mmol/L (ref 135–145)

## 2015-11-24 LAB — I-STAT TROPONIN, ED
Troponin i, poc: 0.01 ng/mL (ref 0.00–0.08)
Troponin i, poc: 0 ng/mL (ref 0.00–0.08)

## 2015-11-24 LAB — CBC
HEMATOCRIT: 39.6 % (ref 39.0–52.0)
Hemoglobin: 13.4 g/dL (ref 13.0–17.0)
MCH: 30.9 pg (ref 26.0–34.0)
MCHC: 33.8 g/dL (ref 30.0–36.0)
MCV: 91.2 fL (ref 78.0–100.0)
PLATELETS: 308 10*3/uL (ref 150–400)
RBC: 4.34 MIL/uL (ref 4.22–5.81)
RDW: 12.5 % (ref 11.5–15.5)
WBC: 10.7 10*3/uL — ABNORMAL HIGH (ref 4.0–10.5)

## 2015-11-24 MED ORDER — PREDNISONE 20 MG PO TABS
40.0000 mg | ORAL_TABLET | Freq: Once | ORAL | Status: AC
  Administered 2015-11-24: 40 mg via ORAL
  Filled 2015-11-24: qty 2

## 2015-11-24 MED ORDER — IPRATROPIUM-ALBUTEROL 0.5-2.5 (3) MG/3ML IN SOLN
3.0000 mL | Freq: Once | RESPIRATORY_TRACT | Status: AC
  Administered 2015-11-24: 3 mL via RESPIRATORY_TRACT
  Filled 2015-11-24: qty 3

## 2015-11-24 NOTE — ED Triage Notes (Addendum)
Pt arrives by EMS c/o generalized weakness and right sided chest pain for the past few days with dizziness. Pt received aspirin 325 mg and 1 Nitro en route. EMS reports Fire heard wheezing upon assessment and gave the pt 2 albuterol treatments and pt lung sounds are now clear.  Pt also c/o leg and feet pain. EMS reports pt [place] been traveling from [place] today and was discharged from a facility in [place] prior to traveling to [place]. Pt alert and oriented, NAD at this time.

## 2015-11-24 NOTE — ED Notes (Signed)
Patient transported to X-ray 

## 2015-11-24 NOTE — ED Notes (Addendum)
Pt attempted to ambulate but was unable to stand up from the bed. Pt stated "im scared and I feel bad." Pt states he feels dizzy when he sits up. Pt also c/o shortness of breath while lying in bed. Pt repeatedly stating "this has got me scared". When asked what is making him scared he states "I dont know why I feel like this."

## 2015-11-24 NOTE — ED Notes (Signed)
These are some phone numbers that might be able to shed some light on patient's history.    [phone] [facility] [phone] [facility]

## 2015-11-24 NOTE — ED Provider Notes (Signed)
[facility]-EMERGENCY DEPT Provider Note   CSN: [id number] Arrival date & time: 11/24/15  1934     History   Chief Complaint Chief Complaint  Patient presents with  . Chest Pain    HPI Cody Ortiz is a 60 y.o. male.  HPI Patient is a 60 year old male with past medical history of COPD, diabetes, hypertension who presents with multiple complaints including chest pain and discomfort in his bilateral feet. Patient reports that he was just discharged from a rehabilitation facility in [place] this morning. He was having right-sided chest discomfort he describes as an ache at the time of discharge but did not tell anyone. The pain does not radiate to his arm jaw or back. He has mild associated shortness of breath. He also complains of intermittent pain in his bilateral feet making it hard for him to walk. Patient [place] bus [place] today. He reports that EMS was called at the bus station as he is having difficulty ambulating due to discomfort in his feet. He reports several falls though the timing of these is difficult to elicit. Denies loss of consciousness, hitting head or other injuries.  Past Medical History:  Diagnosis Date  . Asthma   . COPD (chronic obstructive pulmonary disease) (HCC)   . Diabetes mellitus without complication (HCC)   . Hypertension     There are no active problems to display for this patient.   Past Surgical History:  Procedure Laterality Date  . NECK SURGERY         Home Medications    Prior to Admission medications   Medication Sig Start Date End Date Taking? Authorizing Provider  amLODipine (NORVASC) 10 MG tablet Take 10 mg by mouth daily.   Yes Historical Provider, MD  aspirin 325 MG tablet Take 325 mg by mouth daily.   Yes Historical Provider, MD  lisinopril (PRINIVIL,ZESTRIL) 40 MG tablet Take 40 mg by mouth daily.   Yes Historical Provider, MD  lovastatin (MEVACOR) 40 MG tablet Take 40 mg by mouth daily.    Yes Historical  Provider, MD    Family History No family history on file.  Social History Social History  Substance Use Topics  . Smoking status: Current Every Day Smoker    Packs/day: 0.50  . Smokeless tobacco: Never Used  . Alcohol use Yes     Comment: "very little"     Allergies   Patient has no allergy information on record.   Review of Systems Review of Systems  Constitutional: Positive for fatigue. Negative for chills and fever.  HENT: Negative for ear pain and sore throat.   Eyes: Negative for pain and visual disturbance.  Respiratory: Positive for shortness of breath. Negative for cough.   Cardiovascular: Positive for chest pain. Negative for palpitations.  Gastrointestinal: Positive for nausea. Negative for abdominal pain and vomiting.  Genitourinary: Negative for dysuria and hematuria.  Musculoskeletal: Positive for gait problem. Negative for arthralgias and back pain.  Skin: Negative for color change and rash.  Neurological: Negative for seizures and syncope.  All other systems reviewed and are negative.    Physical Exam Updated Vital Signs BP 148/97 (BP Location: Left Arm)   Pulse 60   Temp 97.8 F (36.6 C) (Oral)   Resp 20   Ht 5\' 8"  (1.727 m)   Wt 56.7 kg   SpO2 96%   BMI 19.01 kg/m   Physical Exam  Constitutional: He is oriented to person, place, and time.  Thin, appears chronically ill.  HENT:  Head: Normocephalic and atraumatic.  Eyes: Conjunctivae are normal.  Neck: Neck supple.  Cardiovascular: Normal rate and regular rhythm.   No murmur heard. Pulmonary/Chest: Effort normal. No respiratory distress. He has wheezes (scattered expiratory wheezes). He exhibits no tenderness.  Abdominal: Soft. He exhibits no mass. There is no tenderness. There is no rebound and no guarding.  Musculoskeletal: Normal range of motion. He exhibits no edema or tenderness.  Neurological: He is alert and oriented to person, place, and time.  Skin: Skin is warm and dry.  Long  toenails  Psychiatric: He has a normal mood and affect.  Nursing note and vitals reviewed.    ED Treatments / Results  Labs (all labs ordered are listed, but only abnormal results are displayed) Labs Reviewed  BASIC METABOLIC PANEL - Abnormal; Notable for the following:       Result Value   Potassium 3.4 (*)    All other components within normal limits  CBC - Abnormal; Notable for the following:    WBC 10.7 (*)    All other components within normal limits  I-STAT TROPOININ, ED  I-STAT TROPOININ, ED    EKG  EKG Interpretation  Date/Time:  Saturday November 24 2015 19:44:53 EST Ventricular Rate:  62 PR Interval:    QRS Duration: 86 QT Interval:  423 QTC Calculation: 423 R Axis:   58 Text Interpretation:  Sinus rhythm LVH by voltage no old Confirmed by ZAVITZ MD, JOSHUA [phone]) on 11/24/2015 10:39:58 PM       Radiology Dg Chest 2 View  Result Date: 11/24/2015 CLINICAL DATA:  Right-sided chest pain for 1 day EXAM: CHEST  2 VIEW COMPARISON:  None. FINDINGS: Lungs are hyperexpanded. No edema or consolidation. Heart size and pulmonary vascularity are normal. Aorta is somewhat tortuous with atherosclerotic calcification present. There is mild degenerative change in the thoracic spine. No evident adenopathy. IMPRESSION: Lungs hyperexpanded without apparent edema or consolidation. Tortuous aorta with aortic atherosclerosis. Electronically Signed   By: Bretta BangWilliam  Woodruff III M.D.   On: 11/24/2015 20:51    Procedures Procedures (including critical care time)  Medications Ordered in ED Medications  ipratropium-albuterol (DUONEB) 0.5-2.5 (3) MG/3ML nebulizer solution 3 mL (3 mLs Nebulization Given 11/24/15 2106)  predniSONE (DELTASONE) tablet 40 mg (40 mg Oral Given 11/24/15 2117)     Initial Impression / Assessment and Plan / ED Course  I have reviewed the triage vital signs and the nursing notes.  Pertinent labs & imaging results that were available during my care of the  patient were reviewed by me and considered in my medical decision making (see chart for details).  Clinical Course    Patient is a 60 year old male with past medical history as above who presents with multiple and changing complaints including chest discomfort and foot discomfort. Of note patient apparently with the bus from [place] to [place] today. He reports he is supposed to be staying with some friends but is unable to provide phone number to contact information for them.   On presentation patient is afebrile, VSS. Exam with expiratory wheezes. Overall poor hygiene. No other significant findings. EKG without acute ischemic changes. Chest x-ray with hyperexpanded lungs but no edema, consolidation or signs of infection. Labs obtained. CBC and CMP unremarkable. I-STAT troponin within normal limits 2. DuoNeb and steroids given. On retardation patient reports improvement in his symptoms. Remained HDS on multiple re-evaluations. Patient was discharged in stable condition. As patient may be homeless he was provided with Librarian, academicresource assistant for shelters,  social services and transportation. Reports he has medicines and inhalers. Referred to [facility] for PCP follow-up.  Patient seen and discussed with Dr. Jodi Mourning, ED attending  Final Clinical Impressions(s) / ED Diagnoses   Final diagnoses:  Chest wall pain  Social problem    New Prescriptions New Prescriptions   No medications on file     Isa Rankin, MD 11/25/15 0030    Blane Ohara, MD 11/26/15 1610

## 2015-11-24 NOTE — ED Notes (Signed)
Attempted to ambulate pt. Pt requesting to wait 5 mins before ambulating. Pt c/o nausea after administering nebulizer.

## 2015-11-25 ENCOUNTER — Emergency Department (HOSPITAL_COMMUNITY)
Admission: EM | Admit: 2015-11-25 | Discharge: 2015-11-25 | Disposition: A | Discharge status: Home / Self Care | Payer: Medicaid - Out of State | Source: Home / Self Care

## 2015-11-25 ENCOUNTER — Encounter (HOSPITAL_COMMUNITY): Payer: Self-pay | Admitting: Emergency Medicine

## 2015-11-25 DIAGNOSIS — E119 Type 2 diabetes mellitus without complications: Secondary | ICD-10-CM | POA: Insufficient documentation

## 2015-11-25 DIAGNOSIS — I252 Old myocardial infarction: Secondary | ICD-10-CM | POA: Insufficient documentation

## 2015-11-25 DIAGNOSIS — Z79899 Other long term (current) drug therapy: Secondary | ICD-10-CM | POA: Insufficient documentation

## 2015-11-25 DIAGNOSIS — J449 Chronic obstructive pulmonary disease, unspecified: Secondary | ICD-10-CM | POA: Insufficient documentation

## 2015-11-25 DIAGNOSIS — M79671 Pain in right foot: Secondary | ICD-10-CM | POA: Insufficient documentation

## 2015-11-25 DIAGNOSIS — I10 Essential (primary) hypertension: Secondary | ICD-10-CM | POA: Insufficient documentation

## 2015-11-25 DIAGNOSIS — J45909 Unspecified asthma, uncomplicated: Secondary | ICD-10-CM | POA: Insufficient documentation

## 2015-11-25 DIAGNOSIS — M79672 Pain in left foot: Secondary | ICD-10-CM | POA: Insufficient documentation

## 2015-11-25 DIAGNOSIS — F17210 Nicotine dependence, cigarettes, uncomplicated: Secondary | ICD-10-CM | POA: Insufficient documentation

## 2015-11-25 NOTE — ED Triage Notes (Signed)
Pt. reports bilateral feet pain onset this morning while walking to the bus stop , denies injury or fall , ambulatory using his walker.

## 2015-11-26 ENCOUNTER — Encounter (HOSPITAL_COMMUNITY): Payer: Self-pay | Admitting: Emergency Medicine

## 2016-07-10 ENCOUNTER — Emergency Department (HOSPITAL_COMMUNITY): Payer: Medicaid - Out of State

## 2016-07-10 ENCOUNTER — Emergency Department (HOSPITAL_COMMUNITY)
Admission: EM | Admit: 2016-07-10 | Discharge: 2016-07-10 | Disposition: A | Discharge status: Home / Self Care | Payer: Medicaid - Out of State | Attending: Emergency Medicine | Admitting: Emergency Medicine

## 2016-07-10 DIAGNOSIS — Z7982 Long term (current) use of aspirin: Secondary | ICD-10-CM | POA: Insufficient documentation

## 2016-07-10 DIAGNOSIS — R5383 Other fatigue: Secondary | ICD-10-CM

## 2016-07-10 DIAGNOSIS — R531 Weakness: Secondary | ICD-10-CM | POA: Insufficient documentation

## 2016-07-10 DIAGNOSIS — E119 Type 2 diabetes mellitus without complications: Secondary | ICD-10-CM | POA: Insufficient documentation

## 2016-07-10 DIAGNOSIS — F17210 Nicotine dependence, cigarettes, uncomplicated: Secondary | ICD-10-CM | POA: Insufficient documentation

## 2016-07-10 DIAGNOSIS — F10929 Alcohol use, unspecified with intoxication, unspecified: Secondary | ICD-10-CM

## 2016-07-10 DIAGNOSIS — E860 Dehydration: Secondary | ICD-10-CM

## 2016-07-10 DIAGNOSIS — F10188 Alcohol abuse with other alcohol-induced disorder: Secondary | ICD-10-CM | POA: Insufficient documentation

## 2016-07-10 DIAGNOSIS — I10 Essential (primary) hypertension: Secondary | ICD-10-CM | POA: Insufficient documentation

## 2016-07-10 DIAGNOSIS — I252 Old myocardial infarction: Secondary | ICD-10-CM | POA: Insufficient documentation

## 2016-07-10 DIAGNOSIS — J449 Chronic obstructive pulmonary disease, unspecified: Secondary | ICD-10-CM | POA: Insufficient documentation

## 2016-07-10 LAB — CBC WITH DIFFERENTIAL/PLATELET
Basophils Absolute: 0 10*3/uL (ref 0.0–0.1)
Basophils Relative: 1 %
EOS ABS: 0.1 10*3/uL (ref 0.0–0.7)
EOS PCT: 1 %
HCT: 39.7 % (ref 39.0–52.0)
Hemoglobin: 13 g/dL (ref 13.0–17.0)
LYMPHS ABS: 3 10*3/uL (ref 0.7–4.0)
Lymphocytes Relative: 35 %
MCH: 29 pg (ref 26.0–34.0)
MCHC: 32.7 g/dL (ref 30.0–36.0)
MCV: 88.4 fL (ref 78.0–100.0)
Monocytes Absolute: 0.9 10*3/uL (ref 0.1–1.0)
Monocytes Relative: 11 %
Neutro Abs: 4.5 10*3/uL (ref 1.7–7.7)
Neutrophils Relative %: 52 %
PLATELETS: 286 10*3/uL (ref 150–400)
RBC: 4.49 MIL/uL (ref 4.22–5.81)
RDW: 15.3 % (ref 11.5–15.5)
WBC: 8.5 10*3/uL (ref 4.0–10.5)

## 2016-07-10 LAB — COMPREHENSIVE METABOLIC PANEL
ALK PHOS: 74 U/L (ref 38–126)
ALT: 15 U/L — AB (ref 17–63)
AST: 23 U/L (ref 15–41)
Albumin: 3.2 g/dL — ABNORMAL LOW (ref 3.5–5.0)
Anion gap: 12 (ref 5–15)
BUN: 9 mg/dL (ref 6–20)
CALCIUM: 8.4 mg/dL — AB (ref 8.9–10.3)
CO2: 21 mmol/L — AB (ref 22–32)
CREATININE: 0.83 mg/dL (ref 0.61–1.24)
Chloride: 108 mmol/L (ref 101–111)
Glucose, Bld: 78 mg/dL (ref 65–99)
Potassium: 3.5 mmol/L (ref 3.5–5.1)
Sodium: 141 mmol/L (ref 135–145)
Total Bilirubin: 0.5 mg/dL (ref 0.3–1.2)
Total Protein: 6.3 g/dL — ABNORMAL LOW (ref 6.5–8.1)

## 2016-07-10 LAB — ETHANOL: ALCOHOL ETHYL (B): 126 mg/dL — AB (ref <5)

## 2016-07-10 LAB — I-STAT TROPONIN, ED: TROPONIN I, POC: 0.01 ng/mL (ref 0.00–0.08)

## 2016-07-10 MED ORDER — AMLODIPINE BESYLATE 5 MG PO TABS
10.0000 mg | ORAL_TABLET | Freq: Once | ORAL | Status: AC
  Administered 2016-07-10: 10 mg via ORAL

## 2016-07-10 MED ORDER — LISINOPRIL 20 MG PO TABS
40.0000 mg | ORAL_TABLET | Freq: Once | ORAL | Status: AC
  Administered 2016-07-10: 40 mg via ORAL
  Filled 2016-07-10: qty 2

## 2016-07-10 MED ORDER — THIAMINE HCL 100 MG/ML IJ SOLN
100.0000 mg | Freq: Once | INTRAMUSCULAR | Status: AC
  Administered 2016-07-10: 100 mg via INTRAVENOUS
  Filled 2016-07-10: qty 2

## 2016-07-10 MED ORDER — PRAVASTATIN SODIUM 40 MG PO TABS
40.0000 mg | ORAL_TABLET | Freq: Every day | ORAL | Status: DC
  Administered 2016-07-10: 40 mg via ORAL
  Filled 2016-07-10: qty 1

## 2016-07-10 MED ORDER — SODIUM CHLORIDE 0.9 % IV BOLUS (SEPSIS)
1000.0000 mL | Freq: Once | INTRAVENOUS | Status: AC
  Administered 2016-07-10: 1000 mL via INTRAVENOUS

## 2016-07-10 NOTE — Progress Notes (Signed)
CSW called [facility] on behalf of pt.  The shelter doesn't expect to have any beds available until Monday, 07/14/16.  Shelter list faxed over to the ED in case pt would want to explore other, out of town shelter options.  Pollyann SavoyJody Nina Mondor, LCSW Evening/ED Coverage [phone]

## 2016-07-10 NOTE — ED Notes (Signed)
Bed: [facility] Expected date:  Expected time:  Means of arrival:  Comments: 

## 2016-07-10 NOTE — ED Triage Notes (Signed)
Per EMS. Pt homeless. Pt reports he has been walking outside, and cannot remember when he last ate or drank anything. Complains of generalized weakness. Hx of etoh abuse as well, but says he only drank 1 beer today.

## 2016-07-10 NOTE — Discharge Instructions (Signed)
Please make sure you follow up with a primary care doctor. Return to the ED if your symptoms worsen.

## 2016-07-10 NOTE — ED Provider Notes (Signed)
[facility]-EMERGENCY DEPT Provider Note   CSN: [id number] Arrival date & time: 07/10/16  1259     History   Chief Complaint Chief Complaint  Patient presents with  . Fatigue    HPI Cody Ortiz is a 62 y.o. male.  HPI 61 year old Caucasian male past medical history significant for COPD, diabetes, hypertension, homelessness that presents to the emergency department today that presents to the emergency department today with generalized complaints of weakness, poor by mouth intake. Patient does have history of homelessness. Does see several hospitals in the area. When asked he complains of generalized weakness. States that he has not eaten or drank anything in the past week. He has been walking outside. Uses a walker at baseline. Doesn't history of EtOH abuse. States that he drank 3 beers today. Poor historian likely due to alcohol intoxication.  Pt denies any fever, chill, ha, vision changes, lightheadedness, dizziness, congestion, neck pain, cp, sob, cough, abd pain, n/v/d, urinary symptoms, change in bowel habits, melena, hematochezia, lower extremity paresthesias.  Past Medical History:  Diagnosis Date  . Asthma   . COPD (chronic obstructive pulmonary disease) (HCC)   . Diabetes mellitus without complication (HCC)   . Hypertension   . Myocardial infarction     Patient Active Problem List   Diagnosis Date Noted  . Chest pain 06/02/2015  . COPD exacerbation (HCC) 06/02/2015  . Essential hypertension 06/02/2015  . Hypokalemia 06/02/2015    Past Surgical History:  Procedure Laterality Date  . BACK SURGERY    . NECK SURGERY         Home Medications    Prior to Admission medications   Medication Sig Start Date End Date Taking? Authorizing Provider  albuterol (PROVENTIL HFA;VENTOLIN HFA) 108 (90 Base) MCG/ACT inhaler Inhale 2 puffs into the lungs every 6 (six) hours as needed for wheezing or shortness of breath. 06/03/15   Rhetta Mura, MD  amLODipine (NORVASC) 10 MG  tablet Take 10 mg by mouth daily.    [provider]  aspirin 325 MG tablet Take 325 mg by mouth daily.    [provider]  lisinopril (PRINIVIL,ZESTRIL) 40 MG tablet Take 40 mg by mouth daily.    [provider]  lovastatin (MEVACOR) 40 MG tablet Take 40 mg by mouth daily.     [provider]  predniSONE (DELTASONE) 50 MG tablet Take 1 tablet (50 mg total) by mouth daily with breakfast. 06/03/15   Rhetta Mura, MD    Family History Family History  Problem Relation Age of Onset  . Heart attack Father 34  . Stroke Father     Social History Social History  Substance Use Topics  . Smoking status: Current Every Day Smoker    Packs/day: 0.50    Types: Cigarettes  . Smokeless tobacco: Never Used     Comment: Heavy Smoker  . Alcohol use Yes     Comment: "very little"     Allergies   Patient has no known allergies.   Review of Systems Review of Systems  Constitutional: Positive for appetite change. Negative for chills and fever.  HENT: Negative for congestion and sore throat.   Eyes: Negative for visual disturbance.  Respiratory: Negative for cough and shortness of breath.   Cardiovascular: Negative for chest pain.  Gastrointestinal: Negative for abdominal pain, diarrhea, nausea and vomiting.  Genitourinary: Negative for dysuria, flank pain, frequency, hematuria, scrotal swelling, testicular pain and urgency.  Musculoskeletal: Negative for arthralgias and myalgias.  Skin: Negative for rash.  Neurological:  Positive for weakness. Negative for dizziness, syncope, light-headedness, numbness and headaches.  Psychiatric/Behavioral: Negative for sleep disturbance. The patient is not nervous/anxious.      Physical Exam Updated Vital Signs BP (!) 156/116   Pulse 96   Temp 98.5 F (36.9 C)   Resp 20   SpO2 100%   Physical Exam  Constitutional: He is oriented to person, place, and time. He appears well-developed and well-nourished.   Non-toxic appearance. No distress.  Disheveled. Chronically ill appearing. Appears older than stated age. Nontoxic appearing. Patient with several bandages from prior blood sticks and hospital.  HENT:  Head: Normocephalic and atraumatic.  Mouth/Throat: Oropharynx is clear and moist.  Eyes: Conjunctivae and EOM are normal. Pupils are equal, round, and reactive to light. Right eye exhibits no discharge. Left eye exhibits no discharge.  Neck: Normal range of motion. Neck supple.  Cardiovascular: Normal rate, regular rhythm, normal heart sounds and intact distal pulses.  Exam reveals no gallop and no friction rub.   No murmur heard. Pulmonary/Chest: Effort normal and breath sounds normal. No respiratory distress. He exhibits no tenderness.  Abdominal: Soft. Bowel sounds are normal. He exhibits no distension. There is no tenderness. There is no rebound and no guarding.  Musculoskeletal: Normal range of motion. He exhibits no tenderness.  Lymphadenopathy:    He has no cervical adenopathy.  Neurological: He is alert and oriented to person, place, and time.  The patient is alert, attentive, and oriented x 3. Speech is clear. Cranial nerve II-VII grossly intact. Negative pronator drift. Sensation intact. Strength 5/5 in all extremities. Reflexes 2+ and symmetric at biceps, triceps, knees, and ankles. Rapid alternating movement and fine finger movements intact.    Skin: Skin is warm and dry. Capillary refill takes less than 2 seconds. No rash noted.  Psychiatric: His behavior is normal. Judgment and thought content normal.  Nursing note and vitals reviewed.    ED Treatments / Results  Labs (all labs ordered are listed, but only abnormal results are displayed) Labs Reviewed  COMPREHENSIVE METABOLIC PANEL - Abnormal; Notable for the following:       Result Value   CO2 21 (*)    Calcium 8.4 (*)    Total Protein 6.3 (*)    Albumin 3.2 (*)    ALT 15 (*)    All other components within normal  limits  ETHANOL - Abnormal; Notable for the following:    Alcohol, Ethyl (B) 126 (*)    All other components within normal limits  CBC WITH DIFFERENTIAL/PLATELET  Rosezena SensorI-STAT TROPOININ, ED    EKG  EKG Interpretation None       Radiology Dg Chest 2 View  Result Date: 07/10/2016 CLINICAL DATA:  Generalized weakness. EXAM: CHEST  2 VIEW COMPARISON:  06/02/2015 FINDINGS: Lungs are hyperexpanded but clear. The cardio pericardial silhouette is enlarged. The visualized bony structures of the thorax are intact. Telemetry leads overlie the chest. IMPRESSION: No acute findings. Electronically Signed   By: Kennith CenterEric  Mansell M.D.   On: 07/10/2016 16:33   Ct Head Wo Contrast  Result Date: 07/10/2016 CLINICAL DATA:  Generalized weakness.  Suspected dehydration. EXAM: CT HEAD WITHOUT CONTRAST TECHNIQUE: Contiguous axial images were obtained from the base of the skull through the vertex without intravenous contrast. COMPARISON:  None. FINDINGS: Brain: No evidence of acute hemorrhage, hydrocephalus, extra-axial collection or mass lesion/mass effect. Moderate brain parenchymal volume loss and periventricular microangiopathy. 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 Vascular: No hyperdense vessel or unexpected calcification. Skull: Normal.  Negative for fracture or focal lesion. Sinuses/Orbits: No acute finding. Other: None. IMPRESSION: Moderate brain parenchymal atrophy and chronic microvascular disease. 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 Electronically Signed   By: Ted Mcalpine M.D.   On: 07/10/2016 16:33    Procedures Procedures (including critical care time)  Medications Ordered in ED Medications  pravastatin (PRAVACHOL) tablet 40 mg (40 mg Oral Given 07/10/16 2135)  sodium chloride 0.9 % bolus 1,000 mL (0 mLs Intravenous Stopped 07/10/16 1748)  thiamine (B-1) injection 100 mg (100 mg Intravenous Given 07/10/16 1749)  amLODipine (NORVASC) tablet 10  mg (10 mg Oral Given 07/10/16 2135)  lisinopril (PRINIVIL,ZESTRIL) tablet 40 mg (40 mg Oral Given 07/10/16 2135)     Initial Impression / Assessment and Plan / ED Course  I have reviewed the triage vital signs and the nursing notes.  Pertinent labs & imaging results that were available during my care of the patient were reviewed by me and considered in my medical decision making (see chart for details).     Patient presents with really no obvious complaint today except for generalized weakness. He is poor historian and cannot provide a very good history of presentation or past medical history. Based on chart review patient has multiple chronic medical problems stemming from alcohol intoxication. Patient sleeping comfortably in exam room. He did eat 2 sandweiches and has been drinking fluids here in the emergency department without any problems. Blood work is unremarkable as well as an unremarkable chest x-ray for any acute findings. Pt seems to be exhibiting malingering aspects due to be homeless. He is asking if he can rest in the ED until the morning. He is able to walk with normal gait and walker. Head Ct unremarkable for any acute changes. Doubt CVA or encephalopathy given alert and oriented x 4. EKG shows no ischemic changes. Troponin is negative. Patient given blood pressure medicine the ED. Did give him thiamine. Alcohol is elevated at 126. However given patient's normal gait and by mouth intake that he is clinically sober at this time. Patient has nowhere to go and cannot manage plus. Patient will sleep in the ED until social worker complacent in shelter in the a.m. At this time he is hemodynamically stable. Pt seen and evaluated by Dr. Clarene Duke who is agreeable to above plan.     Final Clinical Impressions(s) / ED Diagnoses   Final diagnoses:  Other fatigue  Dehydration  Alcoholic intoxication with complication [facility])    New Prescriptions New Prescriptions   No medications on file       Wallace Keller 07/11/16 4782    Little, Ambrose Finland, MD 07/11/16 1540

## 2016-07-10 NOTE — ED Notes (Signed)
Patient ambulated in hallway with his personal walker with minimal assistance. Patient states he still "hurts everywhere." Patient requests 2nd sandwich and juice- patient provided with food per request.

## 2016-07-10 NOTE — ED Notes (Signed)
Spoke with Geographical information systems officerJody-social worker on call. No shelters available tonight-patient uses a walker to ambulate. Patient will board here until tomorrow when social work is here and can get patient in to a shelter. Patient unable to navigate the bus.

## 2016-07-11 ENCOUNTER — Emergency Department (HOSPITAL_COMMUNITY)
Admission: EM | Admit: 2016-07-11 | Discharge: 2016-07-11 | Disposition: A | Discharge status: Home / Self Care | Payer: Medicaid - Out of State | Attending: Emergency Medicine | Admitting: Emergency Medicine

## 2016-07-11 ENCOUNTER — Encounter (HOSPITAL_COMMUNITY): Payer: Self-pay | Admitting: Emergency Medicine

## 2016-07-11 DIAGNOSIS — J45909 Unspecified asthma, uncomplicated: Secondary | ICD-10-CM | POA: Insufficient documentation

## 2016-07-11 DIAGNOSIS — Z79899 Other long term (current) drug therapy: Secondary | ICD-10-CM | POA: Insufficient documentation

## 2016-07-11 DIAGNOSIS — F17210 Nicotine dependence, cigarettes, uncomplicated: Secondary | ICD-10-CM | POA: Insufficient documentation

## 2016-07-11 DIAGNOSIS — Z7952 Long term (current) use of systemic steroids: Secondary | ICD-10-CM | POA: Insufficient documentation

## 2016-07-11 DIAGNOSIS — I10 Essential (primary) hypertension: Secondary | ICD-10-CM | POA: Insufficient documentation

## 2016-07-11 DIAGNOSIS — E119 Type 2 diabetes mellitus without complications: Secondary | ICD-10-CM | POA: Insufficient documentation

## 2016-07-11 DIAGNOSIS — Z7982 Long term (current) use of aspirin: Secondary | ICD-10-CM | POA: Insufficient documentation

## 2016-07-11 DIAGNOSIS — R52 Pain, unspecified: Secondary | ICD-10-CM | POA: Insufficient documentation

## 2016-07-11 DIAGNOSIS — J449 Chronic obstructive pulmonary disease, unspecified: Secondary | ICD-10-CM | POA: Insufficient documentation

## 2016-07-11 LAB — URINALYSIS, ROUTINE W REFLEX MICROSCOPIC
Bilirubin Urine: NEGATIVE
Glucose, UA: NEGATIVE mg/dL
Hgb urine dipstick: NEGATIVE
KETONES UR: NEGATIVE mg/dL
Leukocytes, UA: NEGATIVE
Nitrite: NEGATIVE
PROTEIN: 30 mg/dL — AB
Specific Gravity, Urine: 1.027 (ref 1.005–1.030)
pH: 5 (ref 5.0–8.0)

## 2016-07-11 MED ORDER — IBUPROFEN 200 MG PO TABS
600.0000 mg | ORAL_TABLET | Freq: Once | ORAL | Status: AC
  Administered 2016-07-11: 600 mg via ORAL
  Filled 2016-07-11: qty 3

## 2016-07-11 NOTE — ED Provider Notes (Signed)
[facility]-EMERGENCY DEPT Provider Note    By signing my name below, I, Cody Ortiz, attest that this documentation has been prepared under the direction and in the presence of Cody Dorton, PA-C. Electronically Signed: [name], ED Scribe. 07/11/16. 5:29 PM.    History   Chief Complaint Chief Complaint  Patient presents with  . pain all over    The history is provided by the patient and medical records. No language interpreter was used.    Cody JenkinsClifford Fuqua is a homeless 61 y.o. male with PMHx of asthma, COPD, DM, HTN brought in by EMS who presents to the Emergency Department complaining of Feeling bad and generalized pain. When asked what is different today than yesterday, patient states "it's all bad." He reports intermittent pain to the chest that began earlier this morning, and associated subjective fever, chills, cough and SOB that also began this morning. He reports increased frequency of urination. He has not taken anything for his symptoms. There are no modifying factors noted. He denies nausea, vomiting, dysuria, hematuria, penile discharge, bowel movement changes, abdominal pain. He reports h/o kidney transplant. Per chart review, patient has no history of kidney transplant, nor is he listed to be on immunosuppressants. Patient was seen in the ED yesterday evening for the same. He asked to sleep in the emergency room yesterday, as he had no where else to go. He states that he didn't want the implants to bring him to the hospital, because he knew he couldn't stay here. Labs yesterday reassuring, significant for increased levels of ethanol. Patient wondering if he can go out to the waiting room and sit there for a while.   Past Medical History:  Diagnosis Date  . Asthma   . COPD (chronic obstructive pulmonary disease) (HCC)   . Diabetes mellitus without complication (HCC)   . Hypertension   . Myocardial infarction [name](HCC)     Patient Active Problem List   Diagnosis  Date Noted  . Chest pain 06/02/2015  . COPD exacerbation (HCC) 06/02/2015  . Essential hypertension 06/02/2015  . Hypokalemia 06/02/2015    Past Surgical History:  Procedure Laterality Date  . BACK SURGERY    . NECK SURGERY         Home Medications    Prior to Admission medications   Medication Sig Start Date End Date Taking? Authorizing Provider  albuterol (PROVENTIL HFA;VENTOLIN HFA) 108 (90 Base) MCG/ACT inhaler Inhale 2 puffs into the lungs every 6 (six) hours as needed for wheezing or shortness of breath. 06/03/15   Rhetta Ortiz, [name], MD  amLODipine (NORVASC) 10 MG tablet Take 10 mg by mouth daily.    [provider]  aspirin 325 MG tablet Take 325 mg by mouth daily.    [provider]  lisinopril (PRINIVIL,ZESTRIL) 40 MG tablet Take 40 mg by mouth daily.    [provider]  lovastatin (MEVACOR) 40 MG tablet Take 40 mg by mouth daily.     [provider]  predniSONE (DELTASONE) 50 MG tablet Take 1 tablet (50 mg total) by mouth daily with breakfast. 06/03/15   Rhetta Ortiz, [name], MD    Family History Family History  Problem Relation Age of Onset  . Heart attack Father 2360  . Stroke Father     Social History Social History  Substance Use Topics  . Smoking status: Current Every Day Smoker    Packs/day: 0.50    Types: Cigarettes  . Smokeless tobacco: Never Used     Comment: Heavy Smoker  .  Alcohol use Yes     Comment: "very little"     Allergies   Patient has no known allergies.   Review of Systems Review of Systems  HENT: Negative for sore throat.   Respiratory: Positive for cough and shortness of breath.   Gastrointestinal: Negative for abdominal pain, nausea and vomiting.  Genitourinary: Positive for frequency. Negative for discharge, dysuria and hematuria.  Musculoskeletal: Positive for myalgias.  Skin: Negative for rash and wound.     Physical Exam Updated Vital Signs BP (!) 152/89 (BP Location: Left Arm)    Pulse 60   Temp 98 F (36.7 C) (Oral)   Resp 20   SpO2 97%   Physical Exam  Constitutional: He is oriented to person, place, and time. He appears well-developed and well-nourished.  HENT:  Head: Normocephalic and atraumatic.  Nose: Nose normal.  Mouth/Throat: Uvula is midline, oropharynx is clear and moist and mucous membranes are normal.  Eyes: Conjunctivae are normal. Pupils are equal, round, and reactive to light.  Neck: Normal range of motion.  Cardiovascular: Normal rate, regular rhythm and normal heart sounds.   Pulmonary/Chest: Effort normal and breath sounds normal. No respiratory distress. He has no wheezes. He exhibits no tenderness.  Abdominal: Soft. Bowel sounds are normal. He exhibits no distension. There is no tenderness. There is no guarding.  Musculoskeletal: Normal range of motion.  Lymphadenopathy:    He has no cervical adenopathy.  Neurological: He is alert and oriented to person, place, and time.  Skin: Skin is warm and dry.  Psychiatric: He has a normal mood and affect. His behavior is normal.  Nursing note and vitals reviewed.    ED Treatments / Results  DIAGNOSTIC STUDIES: Oxygen Saturation is 97 % on RA, adequate by my interpretation.   COORDINATION OF CARE: 3:24 PM- Will Order UA to rule out urinary infection. Pt verbalizes understanding and agrees to plan.  Medications  ibuprofen (ADVIL,MOTRIN) tablet 600 mg (600 mg Oral Given 07/11/16 1703)    Labs (all labs ordered are listed, but only abnormal results are displayed) Labs Reviewed  URINALYSIS, ROUTINE W REFLEX MICROSCOPIC - Abnormal; Notable for the following:       Result Value   Color, Urine AMBER (*)    Protein, ur 30 (*)    Bacteria, UA RARE (*)    Squamous Epithelial / LPF 0-5 (*)    All other components within normal limits    EKG  EKG Interpretation None       Radiology Dg Chest 2 View  Result Date: 07/10/2016 CLINICAL DATA:  Generalized weakness. EXAM: CHEST  2 VIEW  COMPARISON:  06/02/2015 FINDINGS: Lungs are hyperexpanded but clear. The cardio pericardial silhouette is enlarged. The visualized bony structures of the thorax are intact. Telemetry leads overlie the chest. IMPRESSION: No acute findings. Electronically Signed   By: Kennith Center M.D.   On: 07/10/2016 16:33   Ct Head Wo Contrast  Result Date: 07/10/2016 CLINICAL DATA:  Generalized weakness.  Suspected dehydration. EXAM: CT HEAD WITHOUT CONTRAST TECHNIQUE: Contiguous axial images were obtained from the base of the skull through the vertex without intravenous contrast. COMPARISON:  None. FINDINGS: Brain: No evidence of acute hemorrhage, hydrocephalus, extra-axial collection or mass lesion/mass effect. Moderate brain parenchymal volume loss and periventricular microangiopathy. 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 Vascular: No hyperdense vessel or unexpected calcification. Skull: Normal. Negative for fracture or focal lesion. Sinuses/Orbits: No acute finding. Other: None. IMPRESSION: Moderate brain parenchymal atrophy and chronic microvascular disease. Probable  age-indeterminate bilateral basal ganglia lacunar infarcts. Electronically Signed   By: Ted Mcalpine M.D.   On: 07/10/2016 16:33    Procedures Procedures (including critical care time)  Medications Ordered in ED Medications  ibuprofen (ADVIL,MOTRIN) tablet 600 mg (600 mg Oral Given 07/11/16 1703)     Initial Impression / Assessment and Plan / ED Course  I have reviewed the triage vital signs and the nursing notes.  Pertinent labs & imaging results that were available during my care of the patient were reviewed by me and considered in my medical decision making (see chart for details).     Patient presenting with generalized weakness and pain. Evaluated in the ED yesterday for the same. Patient asked multiple times about whether he can hang out in the waiting room. Patient seemed very concerned that the  room did not have a TV, but once he noticed, asked if he could sit in the chair watching the TV while eating a soda and a sandwich. Per chart review, yesterday's note reports patient wanted to stay in the emergency room as he had no where else to go, and social work helped him find a shelter.  Chest x-ray, EKG, labs, and CT head done yesterday all reassuring. Will order UA as patient reports increased urinary frequency. Vital signs reassuring. UA negative. At this time, I believe patient is in the emergency room due to social conditions, including homelessness. Will give patient dose of ibuprofen for his pain prior to discharge. When offered a prescription for ibuprofen, patient states he cannot afford this. As such, I doubt he is taking any of his listed medicines. Patient appears safe for discharge at this time. Discussed findings with patient. Patient states he understands and agrees to plan. Patient then asked if he could sit in the waiting room for a while.  Final Clinical Impressions(s) / ED Diagnoses   Final diagnoses:  Generalized pain    New Prescriptions Discharge Medication List as of 07/11/2016  5:00 PM     I personally performed the services described in this documentation, which was scribed in my presence. The recorded information has been reviewed and is accurate.      Alveria Apley, PA-C 07/11/16 1729    Doug Sou, MD 07/16/16 [phone]

## 2016-07-11 NOTE — ED Triage Notes (Signed)
Per PTAR states patient went into convenient store requesting someone call EMS-because he was having pain all over-patient just left this facility was given resources for shelters-patient is homeless

## 2016-07-11 NOTE — Discharge Instructions (Signed)
You may take ibuprofen as needed for pain. It is important that she follow up with either [facility] of your weakness and generalized pain. Return to the emergency department if you develop fever, chills, or any new or worsening symptoms

## 2016-07-12 ENCOUNTER — Emergency Department (HOSPITAL_COMMUNITY)
Admission: EM | Admit: 2016-07-12 | Discharge: 2016-07-12 | Disposition: A | Discharge status: Home / Self Care | Payer: Medicaid - Out of State | Attending: Emergency Medicine | Admitting: Emergency Medicine

## 2016-07-12 ENCOUNTER — Emergency Department (HOSPITAL_COMMUNITY): Payer: Medicaid - Out of State

## 2016-07-12 DIAGNOSIS — R0789 Other chest pain: Secondary | ICD-10-CM

## 2016-07-12 DIAGNOSIS — J449 Chronic obstructive pulmonary disease, unspecified: Secondary | ICD-10-CM | POA: Insufficient documentation

## 2016-07-12 DIAGNOSIS — Z79899 Other long term (current) drug therapy: Secondary | ICD-10-CM | POA: Insufficient documentation

## 2016-07-12 DIAGNOSIS — I10 Essential (primary) hypertension: Secondary | ICD-10-CM | POA: Insufficient documentation

## 2016-07-12 DIAGNOSIS — J45909 Unspecified asthma, uncomplicated: Secondary | ICD-10-CM | POA: Insufficient documentation

## 2016-07-12 DIAGNOSIS — Z7982 Long term (current) use of aspirin: Secondary | ICD-10-CM | POA: Insufficient documentation

## 2016-07-12 DIAGNOSIS — E119 Type 2 diabetes mellitus without complications: Secondary | ICD-10-CM | POA: Insufficient documentation

## 2016-07-12 DIAGNOSIS — F17210 Nicotine dependence, cigarettes, uncomplicated: Secondary | ICD-10-CM | POA: Insufficient documentation

## 2016-07-12 LAB — CBC
HEMATOCRIT: 38.3 % — AB (ref 39.0–52.0)
Hemoglobin: 12.1 g/dL — ABNORMAL LOW (ref 13.0–17.0)
MCH: 28.1 pg (ref 26.0–34.0)
MCHC: 31.6 g/dL (ref 30.0–36.0)
MCV: 88.9 fL (ref 78.0–100.0)
PLATELETS: 245 10*3/uL (ref 150–400)
RBC: 4.31 MIL/uL (ref 4.22–5.81)
RDW: 15 % (ref 11.5–15.5)
WBC: 8.7 10*3/uL (ref 4.0–10.5)

## 2016-07-12 LAB — I-STAT TROPONIN, ED: TROPONIN I, POC: 0.01 ng/mL (ref 0.00–0.08)

## 2016-07-12 LAB — BASIC METABOLIC PANEL
Anion gap: 7 (ref 5–15)
BUN: 7 mg/dL (ref 6–20)
CALCIUM: 7.9 mg/dL — AB (ref 8.9–10.3)
CO2: 21 mmol/L — ABNORMAL LOW (ref 22–32)
CREATININE: 0.83 mg/dL (ref 0.61–1.24)
Chloride: 112 mmol/L — ABNORMAL HIGH (ref 101–111)
GFR calc Af Amer: >60 mL/min (ref >60)
Glucose, Bld: 111 mg/dL — ABNORMAL HIGH (ref 65–99)
POTASSIUM: 3.5 mmol/L (ref 3.5–5.1)
SODIUM: 140 mmol/L (ref 135–145)

## 2016-07-12 LAB — TROPONIN I

## 2016-07-12 MED ORDER — ONDANSETRON HCL 4 MG/2ML IJ SOLN
4.0000 mg | Freq: Once | INTRAMUSCULAR | Status: DC
  Filled 2016-07-12: qty 2

## 2016-07-12 MED ORDER — SODIUM CHLORIDE 0.9 % IV BOLUS (SEPSIS)
1000.0000 mL | Freq: Once | INTRAVENOUS | Status: AC
  Administered 2016-07-12: 1000 mL via INTRAVENOUS

## 2016-07-12 NOTE — ED Notes (Signed)
Pt taken to xray via wheelchair

## 2016-07-12 NOTE — ED Triage Notes (Signed)
[facility], per report pt has mid non radiating CP onset today while sitting ion park bench, pt recently seen for similar symptoms 07/10/16, pt rcvd 324 mg ASA & x 1 sL nitro pta, pain 9/10, A&O x4

## 2016-07-12 NOTE — ED Notes (Signed)
Pt stating "I don't want my paperwork." Paperwork shredded per pt request

## 2016-07-12 NOTE — ED Provider Notes (Signed)
[facility]-EMERGENCY DEPT Provider Note   CSN: [id number] Arrival date & time: 07/12/16  1813     History   Chief Complaint Chief Complaint  Patient presents with  . Chest Pain    HPI Cody Ortiz is a 61 y.o. male.  Pt presents to the ED today with CP.  He is homeless and has been to the ED for the 3rd time in 3 days with pain.  The pt said he was walking down the street today and developed the cp.  He called EMS who brought him here.  He did receive ASA and 1 nitro by EMS.  His pain has not improved.  Pt denies any n/v.      Past Medical History:  Diagnosis Date  . Asthma   . COPD (chronic obstructive pulmonary disease) (HCC)   . Diabetes mellitus without complication (HCC)   . Hypertension   . Myocardial infarction [facility])     Patient Active Problem List   Diagnosis Date Noted  . Chest pain 06/02/2015  . COPD exacerbation (HCC) 06/02/2015  . Essential hypertension 06/02/2015  . Hypokalemia 06/02/2015    Past Surgical History:  Procedure Laterality Date  . BACK SURGERY    . NECK SURGERY         Home Medications    Prior to Admission medications   Medication Sig Start Date End Date Taking? Authorizing Provider  albuterol (PROVENTIL HFA;VENTOLIN HFA) 108 (90 Base) MCG/ACT inhaler Inhale 2 puffs into the lungs every 6 (six) hours as needed for wheezing or shortness of breath. 06/03/15   Rhetta Mura, MD  amLODipine (NORVASC) 10 MG tablet Take 10 mg by mouth daily.    [provider]  aspirin 325 MG tablet Take 325 mg by mouth daily.    [provider]  lisinopril (PRINIVIL,ZESTRIL) 40 MG tablet Take 40 mg by mouth daily.    [provider]  lovastatin (MEVACOR) 40 MG tablet Take 40 mg by mouth daily.     [provider]  predniSONE (DELTASONE) 50 MG tablet Take 1 tablet (50 mg total) by mouth daily with breakfast. 06/03/15   Rhetta Mura, MD    Family History Family History  Problem Relation Age of Onset  .  Heart attack Father 74  . Stroke Father     Social History Social History  Substance Use Topics  . Smoking status: Current Every Day Smoker    Packs/day: 0.50    Types: Cigarettes  . Smokeless tobacco: Never Used     Comment: Heavy Smoker  . Alcohol use Yes     Comment: "very little"     Allergies   Patient has no known allergies.   Review of Systems Review of Systems  Cardiovascular: Positive for chest pain.  All other systems reviewed and are negative.    Physical Exam Updated Vital Signs BP (!) 161/70   Pulse (!) 48   Temp (!) 97.5 F (36.4 C) (Oral)   Resp 19   SpO2 99%   Physical Exam  Constitutional: He is oriented to person, place, and time. He appears well-developed and well-nourished.  HENT:  Head: Normocephalic and atraumatic.  Right Ear: External ear normal.  Left Ear: External ear normal.  Nose: Nose normal.  Mouth/Throat: Oropharynx is clear and moist.  Eyes: Conjunctivae and EOM are normal. Pupils are equal, round, and reactive to light.  Neck: Normal range of motion. Neck supple.  Cardiovascular: Normal rate, regular rhythm, normal heart sounds and intact distal pulses.  Pulmonary/Chest: Effort normal and breath sounds normal.  Abdominal: Soft. Bowel sounds are normal.  Musculoskeletal: Normal range of motion.  Neurological: He is alert and oriented to person, place, and time.  Skin: Skin is warm.  Psychiatric: He has a normal mood and affect. His behavior is normal. Judgment and thought content normal.  Nursing note and vitals reviewed.    ED Treatments / Results  Labs (all labs ordered are listed, but only abnormal results are displayed) Labs Reviewed  BASIC METABOLIC PANEL - Abnormal; Notable for the following:       Result Value   Chloride 112 (*)    CO2 21 (*)    Glucose, Bld 111 (*)    Calcium 7.9 (*)    All other components within normal limits  CBC - Abnormal; Notable for the following:    Hemoglobin 12.1 (*)    HCT 38.3  (*)    All other components within normal limits  TROPONIN I  I-STAT TROPOININ, ED    EKG  EKG Interpretation  Date/Time:  Saturday July 12 2016 18:25:03 EDT Ventricular Rate:  54 PR Interval:    QRS Duration: 98 QT Interval:  463 QTC Calculation: 439 R Axis:   69 Text Interpretation:  Sinus rhythm Left ventricular hypertrophy No significant change since last tracing Confirmed by Jacalyn LefevreHaviland, Ericca Labra [phone]([id number]) on 07/12/2016 6:52:47 PM       Radiology Dg Chest 2 View  Result Date: 07/12/2016 CLINICAL DATA:  Chest pain EXAM: CHEST  2 VIEW COMPARISON:  July 10, 2016. FINDINGS: There is no edema or consolidation. The heart size and pulmonary vascularity are normal. No adenopathy. No pneumothorax. No bone lesions. IMPRESSION: No edema or consolidation. Electronically Signed   By: Bretta BangWilliam  Woodruff III M.D.   On: 07/12/2016 19:31    Procedures Procedures (including critical care time)  Medications Ordered in ED Medications  ondansetron (ZOFRAN) injection 4 mg (4 mg Intravenous Not Given 07/12/16 1940)  sodium chloride 0.9 % bolus 1,000 mL (0 mLs Intravenous Stopped 07/12/16 2140)     Initial Impression / Assessment and Plan / ED Course  I have reviewed the triage vital signs and the nursing notes.  Pertinent labs & imaging results that were available during my care of the patient were reviewed by me and considered in my medical decision making (see chart for details).    Pt's pain is atypical.  He has 2 sets of negative enzymes.  He is stable for d/c.  Final Clinical Impressions(s) / ED Diagnoses   Final diagnoses:  Atypical chest pain    New Prescriptions New Prescriptions   No medications on file     Jacalyn LefevreHaviland, Mariangela Heldt, MD 07/12/16 2159

## 2016-07-13 ENCOUNTER — Emergency Department (HOSPITAL_COMMUNITY): Payer: Self-pay

## 2016-07-13 ENCOUNTER — Encounter (HOSPITAL_COMMUNITY): Payer: Self-pay | Admitting: Emergency Medicine

## 2016-07-13 ENCOUNTER — Emergency Department (HOSPITAL_COMMUNITY)
Admission: EM | Admit: 2016-07-13 | Discharge: 2016-07-14 | Disposition: A | Discharge status: Home / Self Care | Payer: Self-pay | Attending: Emergency Medicine | Admitting: Emergency Medicine

## 2016-07-13 DIAGNOSIS — I252 Old myocardial infarction: Secondary | ICD-10-CM | POA: Insufficient documentation

## 2016-07-13 DIAGNOSIS — E119 Type 2 diabetes mellitus without complications: Secondary | ICD-10-CM | POA: Insufficient documentation

## 2016-07-13 DIAGNOSIS — Y929 Unspecified place or not applicable: Secondary | ICD-10-CM | POA: Insufficient documentation

## 2016-07-13 DIAGNOSIS — J449 Chronic obstructive pulmonary disease, unspecified: Secondary | ICD-10-CM | POA: Insufficient documentation

## 2016-07-13 DIAGNOSIS — X58XXXA Exposure to other specified factors, initial encounter: Secondary | ICD-10-CM | POA: Insufficient documentation

## 2016-07-13 DIAGNOSIS — Y998 Other external cause status: Secondary | ICD-10-CM | POA: Insufficient documentation

## 2016-07-13 DIAGNOSIS — R079 Chest pain, unspecified: Secondary | ICD-10-CM | POA: Insufficient documentation

## 2016-07-13 DIAGNOSIS — S50812A Abrasion of left forearm, initial encounter: Secondary | ICD-10-CM | POA: Insufficient documentation

## 2016-07-13 DIAGNOSIS — Y9389 Activity, other specified: Secondary | ICD-10-CM | POA: Insufficient documentation

## 2016-07-13 DIAGNOSIS — I10 Essential (primary) hypertension: Secondary | ICD-10-CM | POA: Insufficient documentation

## 2016-07-13 DIAGNOSIS — J45909 Unspecified asthma, uncomplicated: Secondary | ICD-10-CM | POA: Insufficient documentation

## 2016-07-13 DIAGNOSIS — F17210 Nicotine dependence, cigarettes, uncomplicated: Secondary | ICD-10-CM | POA: Insufficient documentation

## 2016-07-13 DIAGNOSIS — G8929 Other chronic pain: Secondary | ICD-10-CM | POA: Insufficient documentation

## 2016-07-13 DIAGNOSIS — R0602 Shortness of breath: Secondary | ICD-10-CM | POA: Insufficient documentation

## 2016-07-13 LAB — CBC WITH DIFFERENTIAL/PLATELET
BASOS ABS: 0.1 10*3/uL (ref 0.0–0.1)
Basophils Relative: 1 %
EOS ABS: 0.2 10*3/uL (ref 0.0–0.7)
Eosinophils Relative: 2 %
HCT: 40.2 % (ref 39.0–52.0)
Hemoglobin: 12.9 g/dL — ABNORMAL LOW (ref 13.0–17.0)
LYMPHS ABS: 4 10*3/uL (ref 0.7–4.0)
Lymphocytes Relative: 46 %
MCH: 28.9 pg (ref 26.0–34.0)
MCHC: 32.1 g/dL (ref 30.0–36.0)
MCV: 89.9 fL (ref 78.0–100.0)
Monocytes Absolute: 0.6 10*3/uL (ref 0.1–1.0)
Monocytes Relative: 7 %
NEUTROS ABS: 3.9 10*3/uL (ref 1.7–7.7)
Neutrophils Relative %: 44 %
PLATELETS: 266 10*3/uL (ref 150–400)
RBC: 4.47 MIL/uL (ref 4.22–5.81)
RDW: 15.3 % (ref 11.5–15.5)
WBC: 8.8 10*3/uL (ref 4.0–10.5)

## 2016-07-13 LAB — TROPONIN I

## 2016-07-13 LAB — BASIC METABOLIC PANEL
Anion gap: 7 (ref 5–15)
CO2: 22 mmol/L (ref 22–32)
CREATININE: 0.78 mg/dL (ref 0.61–1.24)
Calcium: 8.2 mg/dL — ABNORMAL LOW (ref 8.9–10.3)
Chloride: 109 mmol/L (ref 101–111)
GFR calc Af Amer: >60 mL/min (ref >60)
Glucose, Bld: 90 mg/dL (ref 65–99)
Potassium: 3.2 mmol/L — ABNORMAL LOW (ref 3.5–5.1)
SODIUM: 138 mmol/L (ref 135–145)

## 2016-07-13 MED ORDER — ALBUTEROL SULFATE HFA 108 (90 BASE) MCG/ACT IN AERS
2.0000 | INHALATION_SPRAY | Freq: Once | RESPIRATORY_TRACT | Status: AC
  Administered 2016-07-13: 2 via RESPIRATORY_TRACT
  Filled 2016-07-13: qty 6.7

## 2016-07-13 MED ORDER — ACETAMINOPHEN 325 MG PO TABS
650.0000 mg | ORAL_TABLET | Freq: Once | ORAL | Status: AC
  Administered 2016-07-13: 650 mg via ORAL
  Filled 2016-07-13: qty 2

## 2016-07-13 MED ORDER — POTASSIUM CHLORIDE CRYS ER 20 MEQ PO TBCR
40.0000 meq | EXTENDED_RELEASE_TABLET | Freq: Once | ORAL | Status: AC
  Administered 2016-07-13: 40 meq via ORAL
  Filled 2016-07-13: qty 2

## 2016-07-13 MED ORDER — BACITRACIN-NEOMYCIN-POLYMYXIN OINTMENT TUBE
TOPICAL_OINTMENT | Freq: Three times a day (TID) | CUTANEOUS | Status: DC
  Administered 2016-07-13: via TOPICAL
  Administered 2016-07-13: 1 via TOPICAL
  Filled 2016-07-13: qty 14.17

## 2016-07-13 MED ORDER — ALBUTEROL SULFATE (2.5 MG/3ML) 0.083% IN NEBU
5.0000 mg | INHALATION_SOLUTION | Freq: Once | RESPIRATORY_TRACT | Status: DC
  Filled 2016-07-13: qty 6

## 2016-07-13 NOTE — ED Provider Notes (Addendum)
[facility]-EMERGENCY DEPT Provider Note   CSN: [id number] Arrival date & time: 07/13/16  1821     History   Chief Complaint Chief Complaint  Patient presents with  . Arm Pain    HPI Cody Ortiz is a 61 y.o. male.  HPI  61 year old male with a history of COPD, diabetes, hypertension, and prior MI presents with chest pain. Also complaining of left forearm pain. He states the forearm has been hurting for the past 4 days but today he has noticed some skin changes to his forearm. He states it was in the same location that he had a prior EKG sticker on. The chest pain has been constant, 24/7, for the last 2 months. He states it is sharp and nothing makes it better or worse. He has been seen in the ED each day for the last 3 days. He is homeless, although he states he's going back to [place] next week. He also has chronic shortness of breath. He is a smoker. No fever. Chronic cough. Has not taken anything for the pain.  Past Medical History:  Diagnosis Date  . Asthma   . COPD (chronic obstructive pulmonary disease) (HCC)   . Diabetes mellitus without complication (HCC)   . Hypertension   . Myocardial infarction [facility])     Patient Active Problem List   Diagnosis Date Noted  . Chest pain 06/02/2015  . COPD exacerbation (HCC) 06/02/2015  . Essential hypertension 06/02/2015  . Hypokalemia 06/02/2015    Past Surgical History:  Procedure Laterality Date  . BACK SURGERY    . NECK SURGERY         Home Medications    Prior to Admission medications   Medication Sig Start Date End Date Taking? Authorizing Provider  albuterol (PROVENTIL HFA;VENTOLIN HFA) 108 (90 Base) MCG/ACT inhaler Inhale 2 puffs into the lungs every 6 (six) hours as needed for wheezing or shortness of breath. 06/03/15  Yes Rhetta Mura, MD    Family History Family History  Problem Relation Age of Onset  . Heart attack Father 74  . Stroke Father     Social History Social History  Substance Use Topics  .  Smoking status: Current Every Day Smoker    Packs/day: 0.50    Types: Cigarettes  . Smokeless tobacco: Never Used     Comment: Heavy Smoker  . Alcohol use Yes     Comment: "very little"     Allergies   Patient has no known allergies.   Review of Systems Review of Systems  Respiratory: Positive for cough and shortness of breath.   Cardiovascular: Positive for chest pain.  Musculoskeletal: Positive for myalgias.  Skin: Positive for wound.  All other systems reviewed and are negative.    Physical Exam Updated Vital Signs BP 125/85   Pulse (!) 52   Temp (!) 97.5 F (36.4 C) (Oral)   Resp 12   Ht 5\' 9"  (1.753 m)   Wt 63.5 kg (140 lb)   SpO2 98%   BMI 20.67 kg/m   Physical Exam  Constitutional: He is oriented to person, place, and time. No distress.  HENT:  Head: Normocephalic and atraumatic.  Right Ear: External ear normal.  Left Ear: External ear normal.  Nose: Nose normal.  Eyes: Right eye exhibits no discharge. Left eye exhibits no discharge.  Neck: Neck supple.  Cardiovascular: Normal rate, regular rhythm and normal heart sounds.   Pulses:      Radial pulses are 2+ on the right side, and  2+ on the left side.  Pulmonary/Chest: Effort normal. No accessory muscle usage. No tachypnea. No respiratory distress. He has wheezes (diffuse, expiratory). He exhibits tenderness (diffuse, anterior).  Abdominal: Soft. There is no tenderness.  Musculoskeletal: He exhibits no edema.       Left forearm: He exhibits tenderness. He exhibits no swelling.       Arms: Neurological: He is alert and oriented to person, place, and time.  Skin: Skin is warm and dry. He is not diaphoretic.  Nursing note and vitals reviewed.    ED Treatments / Results  Labs (all labs ordered are listed, but only abnormal results are displayed) Labs Reviewed  CBC WITH DIFFERENTIAL/PLATELET - Abnormal; Notable for the following:       Result Value   Hemoglobin 12.9 (*)    All other components  within normal limits  BASIC METABOLIC PANEL - Abnormal; Notable for the following:    Potassium 3.2 (*)    BUN <5 (*)    Calcium 8.2 (*)    All other components within normal limits  TROPONIN I    EKG  EKG Interpretation  Date/Time:  Sunday July 13 2016 22:21:56 EDT Ventricular Rate:  50 PR Interval:    QRS Duration: 94 QT Interval:  489 QTC Calculation: 446 R Axis:   59 Text Interpretation:  Sinus rhythm Left ventricular hypertrophy no significant change since yesterday or 2017 Confirmed by Pricilla Loveless [phone]) on 07/13/2016 10:24:24 PM       Radiology Dg Chest 2 View  Result Date: 07/13/2016 CLINICAL DATA:  Left forearm pain and chest pain EXAM: CHEST  2 VIEW COMPARISON:  07/12/2016, 07/10/2016, 06/02/2015 FINDINGS: Hyperinflation. Small focus of atelectasis or inflammation at the left lower lung. No interval consolidation or effusion. Stable cardiomediastinal silhouette with atherosclerosis. No pneumothorax. Degenerative changes of the spine. IMPRESSION: 1. Hyperinflation. Small focus of atelectasis or inflammation in the left lower lung. Electronically Signed   By: Jasmine Pang M.D.   On: 07/13/2016 23:17   Dg Chest 2 View  Result Date: 07/12/2016 CLINICAL DATA:  Chest pain EXAM: CHEST  2 VIEW COMPARISON:  July 10, 2016. FINDINGS: There is no edema or consolidation. The heart size and pulmonary vascularity are normal. No adenopathy. No pneumothorax. No bone lesions. IMPRESSION: No edema or consolidation. Electronically Signed   By: Bretta Bang III M.D.   On: 07/12/2016 19:31   Dg Forearm Left  Result Date: 07/13/2016 CLINICAL DATA:  Sores on the left arm EXAM: LEFT FOREARM - 2 VIEW COMPARISON:  None. FINDINGS: No fracture or malalignment. No periostitis. Slight permeative appearance of the distal radius and ulna IMPRESSION: 1. No acute osseous abnormality 2. Possible permeative process in the distal radius and ulna, query marrow abnormality (? Myeloma) Electronically Signed    By: Jasmine Pang M.D.   On: 07/13/2016 23:20    Procedures Procedures (including critical care time)  Medications Ordered in ED Medications  neomycin-bacitracin-polymyxin (NEOSPORIN) ointment (1 application Topical Given 07/13/16 2207)  acetaminophen (TYLENOL) tablet 650 mg (650 mg Oral Given 07/13/16 2203)  potassium chloride SA (K-DUR,KLOR-CON) CR tablet 40 mEq (40 mEq Oral Given 07/13/16 2304)  albuterol (PROVENTIL HFA;VENTOLIN HFA) 108 (90 Base) MCG/ACT inhaler 2 puff (2 puffs Inhalation Given 07/13/16 2315)     Initial Impression / Assessment and Plan / ED Course  I have reviewed the triage vital signs and the nursing notes.  Pertinent labs & imaging results that were available during my care of the patient were reviewed by  me and considered in my medical decision making (see chart for details).     Patient feels much better. His chest pain appears to be chronic and has a negative troponin and unchanged ECG. It is reproducible. My suspicion for ACS, PE, or dissection is low. His left forearm wound is superficial and appears irritated more than anything else. No streaking or drainage. We'll cover with topical antibiotics and he will be given this to go home with. He was given an inhaler to go home with. He is currently homeless but is about to move back to [place]. However no acute indication for social work consult immediately in the ED overnight. He will need a primary care doctor for follow-up. I discussed he can do that here in [place] or if he's going back to [place] he needs to find a PCP there. No pneumonia on CXR. Otherwise, no acute emergent findings and he will be discharged with return precautions.  Final Clinical Impressions(s) / ED Diagnoses   Final diagnoses:  Chronic chest pain  Abrasion of left forearm, initial encounter    New Prescriptions New Prescriptions   No medications on file     Pricilla LovelessGoldston, Nao Linz, MD 07/13/16 2330    Pricilla LovelessGoldston, Shontel Santee, MD 07/13/16 [phone]

## 2016-07-13 NOTE — Discharge Instructions (Signed)
Use the antibiotic ointment three times per day. If it worsens or you develop increased pain, fever, etc, return to the ER Use the albuterol inhaler 2 puffs every 4 hours as needed for shortness of breath. It is important to follow up with a primary care doctor

## 2016-07-13 NOTE — ED Triage Notes (Signed)
Pt homeless, brought in by EMS with c/o lower left arm pain onset today. Pt has rash/scab noted to left forearm. Per EMS pt requested them to take him to another hospital in a different county. Pt has post op shoe on B/L feet, per EMS feels pt needs a social consult.

## 2016-07-13 NOTE — ED Notes (Signed)
Patient transported to X-ray 

## 2016-07-15 ENCOUNTER — Emergency Department (HOSPITAL_COMMUNITY)
Admission: EM | Admit: 2016-07-15 | Discharge: 2016-07-16 | Disposition: A | Discharge status: Home / Self Care | Payer: Self-pay | Attending: Emergency Medicine | Admitting: Emergency Medicine

## 2016-07-15 ENCOUNTER — Emergency Department (HOSPITAL_COMMUNITY): Payer: Self-pay

## 2016-07-15 ENCOUNTER — Encounter (HOSPITAL_COMMUNITY): Payer: Self-pay | Admitting: *Deleted

## 2016-07-15 DIAGNOSIS — F17210 Nicotine dependence, cigarettes, uncomplicated: Secondary | ICD-10-CM | POA: Insufficient documentation

## 2016-07-15 DIAGNOSIS — I10 Essential (primary) hypertension: Secondary | ICD-10-CM | POA: Insufficient documentation

## 2016-07-15 DIAGNOSIS — J45909 Unspecified asthma, uncomplicated: Secondary | ICD-10-CM | POA: Insufficient documentation

## 2016-07-15 DIAGNOSIS — E119 Type 2 diabetes mellitus without complications: Secondary | ICD-10-CM | POA: Insufficient documentation

## 2016-07-15 DIAGNOSIS — R079 Chest pain, unspecified: Secondary | ICD-10-CM

## 2016-07-15 DIAGNOSIS — R52 Pain, unspecified: Secondary | ICD-10-CM

## 2016-07-15 DIAGNOSIS — J449 Chronic obstructive pulmonary disease, unspecified: Secondary | ICD-10-CM | POA: Insufficient documentation

## 2016-07-15 LAB — CBC WITH DIFFERENTIAL/PLATELET
BASOS ABS: 0.1 10*3/uL (ref 0.0–0.1)
BASOS PCT: 1 %
EOS ABS: 0 10*3/uL (ref 0.0–0.7)
EOS PCT: 0 %
HCT: 42.4 % (ref 39.0–52.0)
HEMOGLOBIN: 14.1 g/dL (ref 13.0–17.0)
LYMPHS ABS: 3.3 10*3/uL (ref 0.7–4.0)
Lymphocytes Relative: 30 %
MCH: 29.4 pg (ref 26.0–34.0)
MCHC: 33.3 g/dL (ref 30.0–36.0)
MCV: 88.3 fL (ref 78.0–100.0)
Monocytes Absolute: 0.8 10*3/uL (ref 0.1–1.0)
Monocytes Relative: 7 %
NEUTROS PCT: 62 %
Neutro Abs: 7 10*3/uL (ref 1.7–7.7)
PLATELETS: 297 10*3/uL (ref 150–400)
RBC: 4.8 MIL/uL (ref 4.22–5.81)
RDW: 15.3 % (ref 11.5–15.5)
WBC: 11.2 10*3/uL — AB (ref 4.0–10.5)

## 2016-07-15 LAB — COMPREHENSIVE METABOLIC PANEL
ALBUMIN: 3.9 g/dL (ref 3.5–5.0)
ALT: 19 U/L (ref 17–63)
AST: 53 U/L — ABNORMAL HIGH (ref 15–41)
Alkaline Phosphatase: 85 U/L (ref 38–126)
Anion gap: 11 (ref 5–15)
BUN: 12 mg/dL (ref 6–20)
CHLORIDE: 105 mmol/L (ref 101–111)
CO2: 23 mmol/L (ref 22–32)
CREATININE: 1.14 mg/dL (ref 0.61–1.24)
Calcium: 9 mg/dL (ref 8.9–10.3)
GFR calc non Af Amer: >60 mL/min (ref >60)
GLUCOSE: 88 mg/dL (ref 65–99)
Potassium: 5.6 mmol/L — ABNORMAL HIGH (ref 3.5–5.1)
SODIUM: 139 mmol/L (ref 135–145)
Total Bilirubin: 2 mg/dL — ABNORMAL HIGH (ref 0.3–1.2)
Total Protein: 7.2 g/dL (ref 6.5–8.1)

## 2016-07-15 LAB — I-STAT TROPONIN, ED
TROPONIN I, POC: 0.02 ng/mL (ref 0.00–0.08)
Troponin i, poc: 0.02 ng/mL (ref 0.00–0.08)

## 2016-07-15 MED ORDER — ACETAMINOPHEN 325 MG PO TABS
650.0000 mg | ORAL_TABLET | Freq: Once | ORAL | Status: AC
  Administered 2016-07-15: 650 mg via ORAL
  Filled 2016-07-15: qty 2

## 2016-07-15 MED ORDER — MORPHINE SULFATE (PF) 2 MG/ML IV SOLN
2.0000 mg | Freq: Once | INTRAVENOUS | Status: AC
  Administered 2016-07-15: 2 mg via INTRAVENOUS
  Filled 2016-07-15: qty 1

## 2016-07-15 MED ORDER — SODIUM CHLORIDE 0.9 % IV BOLUS (SEPSIS)
1000.0000 mL | Freq: Once | INTRAVENOUS | Status: AC
  Administered 2016-07-15: 1000 mL via INTRAVENOUS

## 2016-07-15 MED ORDER — IPRATROPIUM-ALBUTEROL 0.5-2.5 (3) MG/3ML IN SOLN
3.0000 mL | Freq: Once | RESPIRATORY_TRACT | Status: AC
  Administered 2016-07-15: 3 mL via RESPIRATORY_TRACT
  Filled 2016-07-15: qty 3

## 2016-07-15 MED ORDER — KETOROLAC TROMETHAMINE 30 MG/ML IJ SOLN
30.0000 mg | Freq: Once | INTRAMUSCULAR | Status: AC
  Administered 2016-07-15: 30 mg via INTRAVENOUS
  Filled 2016-07-15: qty 1

## 2016-07-15 NOTE — ED Notes (Signed)
ED Provider at bedside. 

## 2016-07-15 NOTE — ED Notes (Signed)
Respiratory made aware of breathing treatment. 

## 2016-07-15 NOTE — Discharge Instructions (Signed)
You can take Tylenol or Ibuprofen as directed for pain.  Follow-up with the referred primary care clinic for further evaluation.   Attached you'll find a list shelters that you can go to if you need to.  Return to the Emergency Department for any worsening chest pain, difficulty breathing, nausea, vomiting, or any other worsening or concerning symptoms.
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
## 2016-07-15 NOTE — ED Triage Notes (Signed)
Per EMS, pt complains of aches and pains all over body. Pt states he had diarrhea this morning. Pt was seen yesterday for chest pain.

## 2016-07-15 NOTE — ED Notes (Signed)
Patient transported to X-ray 

## 2016-07-16 ENCOUNTER — Encounter (HOSPITAL_COMMUNITY): Payer: Self-pay | Admitting: Emergency Medicine

## 2016-07-16 ENCOUNTER — Other Ambulatory Visit: Payer: Self-pay

## 2016-07-16 ENCOUNTER — Emergency Department (HOSPITAL_COMMUNITY)
Admission: EM | Admit: 2016-07-16 | Discharge: 2016-07-16 | Disposition: A | Discharge status: Home / Self Care | Payer: Medicaid - Out of State | Attending: Emergency Medicine | Admitting: Emergency Medicine

## 2016-07-16 DIAGNOSIS — R0789 Other chest pain: Secondary | ICD-10-CM | POA: Insufficient documentation

## 2016-07-16 DIAGNOSIS — I10 Essential (primary) hypertension: Secondary | ICD-10-CM | POA: Insufficient documentation

## 2016-07-16 DIAGNOSIS — E119 Type 2 diabetes mellitus without complications: Secondary | ICD-10-CM | POA: Insufficient documentation

## 2016-07-16 DIAGNOSIS — F17210 Nicotine dependence, cigarettes, uncomplicated: Secondary | ICD-10-CM | POA: Insufficient documentation

## 2016-07-16 DIAGNOSIS — J441 Chronic obstructive pulmonary disease with (acute) exacerbation: Secondary | ICD-10-CM | POA: Insufficient documentation

## 2016-07-16 DIAGNOSIS — J45909 Unspecified asthma, uncomplicated: Secondary | ICD-10-CM | POA: Insufficient documentation

## 2016-07-16 LAB — I-STAT CHEM 8, ED
BUN: 11 mg/dL (ref 6–20)
Calcium, Ion: 0.9 mmol/L — ABNORMAL LOW (ref 1.15–1.40)
Chloride: 110 mmol/L (ref 101–111)
Creatinine, Ser: 0.9 mg/dL (ref 0.61–1.24)
Glucose, Bld: 146 mg/dL — ABNORMAL HIGH (ref 65–99)
HCT: 36 % — ABNORMAL LOW (ref 39.0–52.0)
Hemoglobin: 12.2 g/dL — ABNORMAL LOW (ref 13.0–17.0)
Potassium: 3.6 mmol/L (ref 3.5–5.1)
Sodium: 141 mmol/L (ref 135–145)
TCO2: 18 mmol/L (ref 0–100)

## 2016-07-16 LAB — I-STAT TROPONIN, ED: Troponin i, poc: 0.03 ng/mL (ref 0.00–0.08)

## 2016-07-16 MED ORDER — ALBUTEROL SULFATE (2.5 MG/3ML) 0.083% IN NEBU
5.0000 mg | INHALATION_SOLUTION | Freq: Once | RESPIRATORY_TRACT | Status: AC
  Administered 2016-07-16: 5 mg via RESPIRATORY_TRACT
  Filled 2016-07-16: qty 6

## 2016-07-16 MED ORDER — PREDNISONE 20 MG PO TABS: ORAL_TABLET | ORAL | 0 refills | Status: AC

## 2016-07-16 NOTE — ED Notes (Signed)
SW at bedside.

## 2016-07-16 NOTE — ED Notes (Signed)
Patient left at this time with all belongings. Refused wheelchair, refused RX, refused to sign. Escorted out of ED by this RN.

## 2016-07-16 NOTE — ED Provider Notes (Signed)
[facility] Provider Note   CSN: [id number] Arrival date & time: 07/16/16  2048     History   Chief Complaint Chief Complaint  Patient presents with  . Shortness of Breath  . Chest Pain    HPI Cody Ortiz is a 61 y.o. male.  HPI Patient presents again to the emergency department. This is the sixth visit to the emergency department this month. He complains of ongoing chest pain radiating to both arms. He also has shortness of breath with wheezing. Has ongoing chronic cough. The patient is homeless and has nowhere to stay. Past Medical History:  Diagnosis Date  . Asthma   . COPD (chronic obstructive pulmonary disease) (HCC)   . Diabetes mellitus without complication (HCC)   . Hypertension   . Myocardial infarction [facility](HCC)     Patient Active Problem List   Diagnosis Date Noted  . Chest pain 06/02/2015  . COPD exacerbation (HCC) 06/02/2015  . Essential hypertension 06/02/2015  . Hypokalemia 06/02/2015    Past Surgical History:  Procedure Laterality Date  . BACK SURGERY    . NECK SURGERY         Home Medications    Prior to Admission medications   Medication Sig Start Date End Date Taking? Authorizing Provider  albuterol (PROVENTIL HFA;VENTOLIN HFA) 108 (90 Base) MCG/ACT inhaler Inhale 2 puffs into the lungs every 6 (six) hours as needed for wheezing or shortness of breath. 06/03/15   Rhetta MuraSamtani, [name], MD  predniSONE (DELTASONE) 20 MG tablet 3 tabs po day one, then 2 tabs daily x 4 days 07/16/16   Loren RacerYelverton, Aby Gessel, MD    Family History Family History  Problem Relation Age of Onset  . Heart attack Father 160  . Stroke Father     Social History Social History  Substance Use Topics  . Smoking status: Current Every Day Smoker    Packs/day: 0.50    Types: Cigarettes  . Smokeless tobacco: Never Used     Comment: Heavy Smoker  . Alcohol use Yes     Comment: "very little"     Allergies   Patient has no known allergies.   Review of  Systems Review of Systems  Constitutional: Negative for chills and fever.  Respiratory: Positive for cough, shortness of breath and wheezing.   Cardiovascular: Positive for chest pain and leg swelling. Negative for palpitations.  Gastrointestinal: Negative for abdominal pain, nausea and vomiting.  Musculoskeletal: Positive for myalgias. Negative for back pain.  Neurological: Negative for dizziness, weakness, light-headedness, numbness and headaches.  All other systems reviewed and are negative.    Physical Exam Updated Vital Signs BP 123/77 (BP Location: Left Arm)   Pulse 61   Temp 98 F (36.7 C) (Oral)   Resp 18   SpO2 94%   Physical Exam  Constitutional: He is oriented to person, place, and time. He appears well-developed and well-nourished.  Disheveled  HENT:  Head: Normocephalic and atraumatic.  Mouth/Throat: Oropharynx is clear and moist. No oropharyngeal exudate.  Eyes: EOM are normal. Pupils are equal, round, and reactive to light.  Neck: Normal range of motion. Neck supple. No JVD present.  No posterior midline cervical tenderness to palpation.  Cardiovascular: Normal rate and regular rhythm.  Exam reveals no gallop and no friction rub.   No murmur heard. Pulmonary/Chest: Effort normal. No respiratory distress. He has wheezes. He has no rales. He exhibits tenderness.  Expiratory wheezing throughout. Patient is in no respiratory distress. Chest tenderness reproduced with palpation over the anterior  chest. There is no crepitance or deformity.  Abdominal: Soft. Bowel sounds are normal. There is no tenderness. There is no rebound and no guarding.  Musculoskeletal: Normal range of motion. He exhibits edema and tenderness.  Mild swelling to the left forearm. No induration or warmth. Distal pulses are 2+.  Lymphadenopathy:    He has no cervical adenopathy.  Neurological: He is alert and oriented to person, place, and time.  Moving all extremities without focal deficit.  Sensation intact.  Skin: Skin is warm and dry. No rash noted. No erythema.  Psychiatric: He has a normal mood and affect. His behavior is normal.  Nursing note and vitals reviewed.    ED Treatments / Results  Labs (all labs ordered are listed, but only abnormal results are displayed) Labs Reviewed  I-STAT CHEM 8, ED - Abnormal; Notable for the following:       Result Value   Glucose, Bld 146 (*)    Calcium, Ion 0.90 (*)    Hemoglobin 12.2 (*)    HCT 36.0 (*)    All other components within normal limits  I-STAT TROPOININ, ED    EKG  EKG Interpretation  Date/Time:  Wednesday July 16 2016 20:46:46 EDT Ventricular Rate:  74 PR Interval:  140 QRS Duration: 88 QT Interval:  396 QTC Calculation: 439 R Axis:   68 Text Interpretation:  Normal sinus rhythm Minimal voltage criteria for LVH, may be normal variant Borderline ECG Confirmed by Ranae Palms  MD, Xaden Kaufman ([id number]) on 07/16/2016 9:02:31 PM Also confirmed by Ranae Palms  MD, Jewelianna Pancoast ([id number]), editor Elita Quick [phone])  on 07/17/2016 7:03:54 AM       Radiology Dg Chest 2 View  Result Date: 07/15/2016 CLINICAL DATA:  Chest pain EXAM: CHEST  2 VIEW COMPARISON:  07/13/2016 FINDINGS: Hyperinflation. No focal pulmonary infiltrate or effusion. Cardiomediastinal silhouette within normal limits. No pneumothorax. IMPRESSION: Hyperinflation.  No focal infiltrate or edema. Electronically Signed   By: Jasmine Pang M.D.   On: 07/15/2016 19:10   Dg Elbow Complete Left  Result Date: 07/15/2016 CLINICAL DATA:  Diffuse body aches, diarrhea. EXAM: LEFT ELBOW - COMPLETE 3+ VIEW COMPARISON:  LEFT forearm radiograph July 13, 2016 FINDINGS: There is no evidence of fracture, dislocation, or joint effusion. Mild marginal spurring consistent with osteoarthrosis. Mild posterior elbow soft tissue swelling without subcutaneous gas or radiopaque foreign bodies. IMPRESSION: Mild osteoarthrosis.  Mild posterior elbow soft tissue swelling. Electronically Signed    By: Awilda Metro M.D.   On: 07/15/2016 19:10    Procedures Procedures (including critical care time)  Medications Ordered in ED Medications  albuterol (PROVENTIL) (2.5 MG/3ML) 0.083% nebulizer solution 5 mg (5 mg Nebulization Given 07/16/16 2211)     Initial Impression / Assessment and Plan / ED Course  I have reviewed the triage vital signs and the nursing notes.  Pertinent labs & imaging results that were available during my care of the patient were reviewed by me and considered in my medical decision making (see chart for details).     Patient with chest x-ray yesterday. I do not believe that repeat is necessary. Troponin is normal. Patient states the pain in his chest has been constant since he was seen yesterday. Single troponin is sufficient to rule out acute MI. No changes to his EKG. Seen by Child psychotherapist and given outpatient resources. Final Clinical Impressions(s) / ED Diagnoses   Final diagnoses:  Chest wall pain  COPD exacerbation [facility])    New Prescriptions Discharge Medication List as of  07/16/2016 10:40 PM    START taking these medications   Details  predniSONE (DELTASONE) 20 MG tablet 3 tabs po day one, then 2 tabs daily x 4 days, Print         Loren Racer, MD 07/17/16 1511

## 2016-07-16 NOTE — ED Notes (Signed)
SW attempted to send to [place] to stay with family but pt doesn't know address, phone number or how to spell name of family members there. Unable to locate family members in chart reviews. Explained to patient that since we cannot get this information, he will be discharged to lobby with resource packet. [facility] Pt upset that he didn't get to stay in ED in a room, in a bed. Verbally deescalated and escorted to lobby.

## 2016-07-16 NOTE — Care Management (Signed)
ED CM met with patient at bedside. Patient reports visiting from [place] and becoming homeless [place]. Patient states, he came to visit some friends but he is not sure of their phone number. ED CSW saw patient on Monday and provided homeless resources, CSW was consulted tonight regarding patient.

## 2016-07-16 NOTE — ED Triage Notes (Signed)
Pt c/o chest pain and shob, arrives via EMS. Seen already this morning for same, states never went away. States "I didn't have anywhere to go." Pt homeless. EMS gave one duoneb in the field

## 2016-08-09 ENCOUNTER — Encounter (HOSPITAL_COMMUNITY): Payer: Self-pay | Admitting: Emergency Medicine

## 2016-08-09 DIAGNOSIS — R410 Disorientation, unspecified: Secondary | ICD-10-CM | POA: Insufficient documentation

## 2016-08-09 DIAGNOSIS — I10 Essential (primary) hypertension: Secondary | ICD-10-CM | POA: Diagnosis not present

## 2016-08-09 DIAGNOSIS — F17210 Nicotine dependence, cigarettes, uncomplicated: Secondary | ICD-10-CM | POA: Diagnosis not present

## 2016-08-09 DIAGNOSIS — E119 Type 2 diabetes mellitus without complications: Secondary | ICD-10-CM | POA: Diagnosis not present

## 2016-08-09 DIAGNOSIS — R0789 Other chest pain: Secondary | ICD-10-CM | POA: Diagnosis not present

## 2016-08-09 DIAGNOSIS — J449 Chronic obstructive pulmonary disease, unspecified: Secondary | ICD-10-CM | POA: Insufficient documentation

## 2016-08-09 DIAGNOSIS — Z79899 Other long term (current) drug therapy: Secondary | ICD-10-CM | POA: Insufficient documentation

## 2016-08-09 NOTE — ED Triage Notes (Signed)
Patient arrives with complaint of mid chest pain. Onset yesterday while at rest. States history of the same with multiple admissions. Also states bilateral arm pain and bilateral foot pain.

## 2016-08-10 ENCOUNTER — Encounter (HOSPITAL_COMMUNITY): Payer: Self-pay | Admitting: *Deleted

## 2016-08-10 ENCOUNTER — Emergency Department (HOSPITAL_COMMUNITY)
Admission: EM | Admit: 2016-08-10 | Discharge: 2016-08-10 | Disposition: A | Discharge status: Home / Self Care | Payer: Medicaid Other | Attending: Emergency Medicine | Admitting: Emergency Medicine

## 2016-08-10 ENCOUNTER — Emergency Department (HOSPITAL_COMMUNITY): Payer: Medicaid Other

## 2016-08-10 DIAGNOSIS — Z5900 Homelessness unspecified: Secondary | ICD-10-CM

## 2016-08-10 DIAGNOSIS — R410 Disorientation, unspecified: Secondary | ICD-10-CM

## 2016-08-10 DIAGNOSIS — R0789 Other chest pain: Secondary | ICD-10-CM

## 2016-08-10 LAB — CBC
HEMATOCRIT: 39.9 % (ref 39.0–52.0)
Hemoglobin: 12.6 g/dL — ABNORMAL LOW (ref 13.0–17.0)
MCH: 28.3 pg (ref 26.0–34.0)
MCHC: 31.6 g/dL (ref 30.0–36.0)
MCV: 89.7 fL (ref 78.0–100.0)
PLATELETS: 327 10*3/uL (ref 150–400)
RBC: 4.45 MIL/uL (ref 4.22–5.81)
RDW: 15.3 % (ref 11.5–15.5)
WBC: 11.7 10*3/uL — AB (ref 4.0–10.5)

## 2016-08-10 LAB — URINALYSIS, ROUTINE W REFLEX MICROSCOPIC
Glucose, UA: NEGATIVE mg/dL
Hgb urine dipstick: NEGATIVE
KETONES UR: NEGATIVE mg/dL
Leukocytes, UA: NEGATIVE
NITRITE: NEGATIVE
PROTEIN: 30 mg/dL — AB
SPECIFIC GRAVITY, URINE: 1.025 (ref 1.005–1.030)
pH: 6 (ref 5.0–8.0)

## 2016-08-10 LAB — RAPID URINE DRUG SCREEN, HOSP PERFORMED
Amphetamines: NOT DETECTED
BARBITURATES: NOT DETECTED
Benzodiazepines: NOT DETECTED
COCAINE: NOT DETECTED
Opiates: NOT DETECTED
TETRAHYDROCANNABINOL: NOT DETECTED

## 2016-08-10 LAB — SALICYLATE LEVEL

## 2016-08-10 LAB — BASIC METABOLIC PANEL
Anion gap: 11 (ref 5–15)
BUN: 10 mg/dL (ref 6–20)
CHLORIDE: 107 mmol/L (ref 101–111)
CO2: 21 mmol/L — ABNORMAL LOW (ref 22–32)
CREATININE: 0.96 mg/dL (ref 0.61–1.24)
Calcium: 9.1 mg/dL (ref 8.9–10.3)
Glucose, Bld: 90 mg/dL (ref 65–99)
POTASSIUM: 3.6 mmol/L (ref 3.5–5.1)
SODIUM: 139 mmol/L (ref 135–145)

## 2016-08-10 LAB — URINALYSIS, MICROSCOPIC (REFLEX)

## 2016-08-10 LAB — I-STAT TROPONIN, ED: Troponin i, poc: 0 ng/mL (ref 0.00–0.08)

## 2016-08-10 LAB — ETHANOL

## 2016-08-10 LAB — ACETAMINOPHEN LEVEL: Acetaminophen (Tylenol), Serum: <10 ug/mL — ABNORMAL LOW (ref 10–30)

## 2016-08-10 NOTE — ED Notes (Addendum)
Patient resting at this time.

## 2016-08-10 NOTE — ED Notes (Signed)
Pt given urinal,  Unable to void at this time

## 2016-08-10 NOTE — ED Provider Notes (Addendum)
[facility]-EMER[facility]ENCY DEPT Provider Note   CSN: [id number] Arrival date & time: 08/09/16  2330     History   Chief Complaint Chief Complaint  Patient presents with  . Chest Pain    HPI Cody Ortiz is a 61 y.o. male.  Patient presents to the emergency department for evaluation of chest pain. Patient reports that he has been experiencing chest pain for some time. He cannot tell me when the pain started. He does report that he has been seen in the hospital multiple times for this and even admitted. He cannot describe the pain. In addition to the chest pain he is complaining of pain over most of his body. He denies any trauma. He feels weak, has not eaten in several days.  Upon evaluation, patient cannot tell me where he is oriented to date. He was confused and cannot answer questions appropriately. Level V Caveat due to confusion.      Past Medical History:  Diagnosis Date  . Asthma   . COPD (chronic obstructive pulmonary disease) (HCC)   . Diabetes mellitus without complication (HCC)   . Hypertension   . Myocardial infarction [facility](HCC)     Patient Active Problem List   Diagnosis Date Noted  . Chest pain 06/02/2015  . COPD exacerbation (HCC) 06/02/2015  . Essential hypertension 06/02/2015  . Hypokalemia 06/02/2015    Past Surgical History:  Procedure Laterality Date  . BACK SURGERY    . NECK SURGERY         Home Medications    Prior to Admission medications   Medication Sig Start Date End Date Taking? Authorizing Provider  albuterol (PROVENTIL HFA;VENTOLIN HFA) 108 (90 Base) MCG/ACT inhaler Inhale 2 puffs into the lungs every 6 (six) hours as needed for wheezing or shortness of breath. 06/03/15   Rhetta MuraSamtani, [name], MD  predniSONE (DELTASONE) 20 MG tablet 3 tabs po day one, then 2 tabs daily x 4 days 07/16/16   Loren RacerYelverton, David, MD    Family History Family History  Problem Relation Age of Onset  . Heart attack Father 4760  . Stroke Father     Social  History Social History  Substance Use Topics  . Smoking status: Current Every Day Smoker    Packs/day: 0.50    Types: Cigarettes  . Smokeless tobacco: Never Used     Comment: Heavy Smoker  . Alcohol use Yes     Comment: "very little"     Allergies   Patient has no known allergies.   Review of Systems Review of Systems  Unable to perform ROS: Mental status change  All other systems reviewed and are negative.    Physical Exam Updated Vital Signs BP 130/85 (BP Location: Left Arm)   Pulse 62   Temp 97.6 F (36.4 C) (Oral)   Resp (!) 21   SpO2 99%   Physical Exam  Constitutional: He appears well-developed and well-nourished. No distress.  HENT:  Head: Normocephalic and atraumatic.  Right Ear: Hearing normal.  Left Ear: Hearing normal.  Nose: Nose normal.  Mouth/Throat: Oropharynx is clear and moist and mucous membranes are normal.  Eyes: Pupils are equal, round, and reactive to light. Conjunctivae and EOM are normal.  Neck: Normal range of motion. Neck supple.  Cardiovascular: Regular rhythm, S1 normal and S2 normal.  Exam reveals no gallop and no friction rub.   No murmur heard. Pulmonary/Chest: Effort normal and breath sounds normal. No respiratory distress. He exhibits no tenderness.  Abdominal: Soft. Normal appearance and bowel sounds  are normal. There is no hepatosplenomegaly. There is no tenderness. There is no rebound, no guarding, no tenderness at McBurney's point and negative Murphy's sign. No hernia.  Musculoskeletal: Normal range of motion.  Neurological: He is alert. He has normal strength. He is disoriented. No cranial nerve deficit or sensory deficit. Coordination normal. GCS eye subscore is 4. GCS verbal subscore is 5. GCS motor subscore is 6.  Skin: Skin is warm, dry and intact. No rash noted. No cyanosis.  Psychiatric: His behavior is normal. Thought content normal. Cognition and memory are impaired.  Nursing note and vitals reviewed.    ED  Treatments / Results  Labs (all labs ordered are listed, but only abnormal results are displayed) Labs Reviewed  BASIC METABOLIC PANEL - Abnormal; Notable for the following:       Result Value   CO2 21 (*)    All other components within normal limits  CBC - Abnormal; Notable for the following:    WBC 11.7 (*)    Hemoglobin 12.6 (*)    All other components within normal limits  ACETAMINOPHEN LEVEL - Abnormal; Notable for the following:    Acetaminophen (Tylenol), Serum <10 (*)    All other components within normal limits  ETHANOL  SALICYLATE LEVEL  URINALYSIS, ROUTINE W REFLEX MICROSCOPIC  RAPID URINE DRUG SCREEN, HOSP PERFORMED  I-STAT TROPONIN, ED    EKG  EKG Interpretation  Date/Time:  Saturday August 09 2016 23:49:41 EDT Ventricular Rate:  77 PR Interval:  126 QRS Duration: 88 QT Interval:  412 QTC Calculation: 466 R Axis:   88 Text Interpretation:  Normal sinus rhythm Biatrial enlargement Left ventricular hypertrophy Nonspecific ST abnormality Abnormal ECG Confirmed by Gilda CreasePollina, Christopher J [phone]) on 08/10/2016 7:35:38 AM       Radiology Dg Chest 2 View  Result Date: 08/10/2016 CLINICAL DATA:  Mid chest pain for 1 day. History of hypertension, COPD smoker. EXAM: CHEST  2 VIEW COMPARISON:  Chest radiographs January 16, 2016 FINDINGS: Cardiac silhouette is normal size. Tortuous aorta associated with chronic hypertension. Mildly calcified aortic knob. Mild chronic interstitial changes increased lung volumes. Strandy densities LEFT lung base. No pneumothorax. Old LEFT rib fractures. Old sternal fracture. Old mild approximate L1 compression fracture. Cervical cerclage wires. IMPRESSION: COPD.  LEFT lung base atelectasis/ scarring. Aortic Atherosclerosis (ICD10-I70.0) and Emphysema (ICD10-J43.9). Electronically Signed   By: Awilda Metroourtnay  Bloomer M.D.   On: 08/10/2016 00:52    Procedures Procedures (including critical care time)  Medications Ordered in ED Medications - No data  to display   Initial Impression / Assessment and Plan / ED Course  I have reviewed the triage vital signs and the nursing notes.  Pertinent labs & imaging results that were available during my care of the patient were reviewed by me and considered in my medical decision making (see chart for details).     Patient apparently initially came to the ER for evaluation of chest pain. This is a common complaint for the patient, has frequent visits for chest pain which is felt to be musculoskeletal in nature in the past. Upon evaluation, however, patient is disoriented. [place] thinks today is [place]. I have not seen him previously, but from the various records it appears that he has always been oriented in the past. I do not see a medical reason for his confusion, will require psychiatric eval.  Final Clinical Impressions(s) / ED Diagnoses   Final diagnoses:  Atypical chest pain  Confusion  New Prescriptions New Prescriptions   No medications on file     Gilda Crease, MD 08/10/16 1610    Gilda Crease, MD 08/25/16 [phone]

## 2016-08-10 NOTE — ED Notes (Signed)
Patient resting at this time. Does not appear to be in distress. [facility] and the Year is [facility]

## 2016-08-10 NOTE — Discharge Instructions (Signed)
Make sure that you are getting plenty of rest, and eating well.  Use the resource guide attached to help you find additional treatment as needed.

## 2016-08-10 NOTE — ED Provider Notes (Signed)
  Face-to-face evaluation   History: Patient presented for chest pain, a recurrent complaint, with frequent recent evaluations for same. He was more confused than typical, so imaging was obtained, and he has been observed in the ED.  Physical exam: Alert, disheveled, appears older than stated age. No respiratory distress.     Patient Vitals for the past 24 hrs:  BP Temp Temp src Pulse Resp SpO2  08/10/16 0815 - - - (!) 44 15 100 %  08/10/16 0800 139/72 - - (!) 46 16 98 %  08/10/16 0730 - - - 62 (!) 21 99 %  08/09/16 2348 130/85 97.6 F (36.4 C) Oral 75 18 98 %   Results for orders placed or performed during the hospital encounter of 08/10/16  Basic metabolic panel  Result Value Ref Range   Sodium 139 135 - 145 mmol/L   Potassium 3.6 3.5 - 5.1 mmol/L   Chloride 107 101 - 111 mmol/L   CO2 21 (L) 22 - 32 mmol/L   Glucose, Bld 90 65 - 99 mg/dL   BUN 10 6 - 20 mg/dL   Creatinine, Ser 2.950.96 0.61 - 1.24 mg/dL   Calcium 9.1 8.9 - 62.110.3 mg/dL   GFR calc non Af Amer >60 >60 mL/min   GFR calc Af Amer >60 >60 mL/min   Anion gap 11 5 - 15  CBC  Result Value Ref Range   WBC 11.7 (H) 4.0 - 10.5 K/uL   RBC 4.45 4.22 - 5.81 MIL/uL   Hemoglobin 12.6 (L) 13.0 - 17.0 g/dL   HCT 30.839.9 65.739.0 - 84.652.0 %   MCV 89.7 78.0 - 100.0 fL   MCH 28.3 26.0 - 34.0 pg   MCHC 31.6 30.0 - 36.0 g/dL   RDW 96.215.3 95.211.5 - 84.115.5 %   Platelets 327 150 - 400 K/uL  Ethanol  Result Value Ref Range   Alcohol, Ethyl (B) <5 <5 mg/dL  Salicylate level  Result Value Ref Range   Salicylate Lvl <7.0 2.8 - 30.0 mg/dL  Acetaminophen level  Result Value Ref Range   Acetaminophen (Tylenol), Serum <10 (L) 10 - 30 ug/mL  I-stat troponin, ED  Result Value Ref Range   Troponin i, poc 0.00 0.00 - 0.08 ng/mL   Comment 3              10:10 AM Reevaluation with update and discussion. After initial assessment and treatment, an updated evaluation reveals , at this time. He is alert and conversant, states he is hungry. He states that  he has been living "on the side of the road." He has voided, and a urine sample report is pending. Clementina Mareno L   12:15- he is awake, alert, and eating, very well at this time. He expresses no other needs or desires.  13:20- he was able to eat to full lunch trays. He is lucid. He states that he will go to that shoulder, and has never had to go there before. Findings discussed with the patient and all questions answered   Mancel BaleWentz, Covey Baller, MD 08/10/16 1327

## 2016-08-24 ENCOUNTER — Encounter (HOSPITAL_COMMUNITY): Payer: Self-pay | Admitting: Emergency Medicine

## 2016-08-24 ENCOUNTER — Emergency Department (HOSPITAL_COMMUNITY)
Admission: EM | Admit: 2016-08-24 | Discharge: 2016-08-24 | Disposition: A | Discharge status: Home / Self Care | Payer: Medicaid - Out of State | Attending: Emergency Medicine | Admitting: Emergency Medicine

## 2016-08-24 ENCOUNTER — Emergency Department (HOSPITAL_COMMUNITY): Payer: Medicaid - Out of State

## 2016-08-24 DIAGNOSIS — K5900 Constipation, unspecified: Secondary | ICD-10-CM | POA: Diagnosis not present

## 2016-08-24 DIAGNOSIS — I10 Essential (primary) hypertension: Secondary | ICD-10-CM | POA: Insufficient documentation

## 2016-08-24 DIAGNOSIS — J45909 Unspecified asthma, uncomplicated: Secondary | ICD-10-CM | POA: Insufficient documentation

## 2016-08-24 DIAGNOSIS — I252 Old myocardial infarction: Secondary | ICD-10-CM | POA: Insufficient documentation

## 2016-08-24 DIAGNOSIS — R072 Precordial pain: Secondary | ICD-10-CM | POA: Insufficient documentation

## 2016-08-24 DIAGNOSIS — F17210 Nicotine dependence, cigarettes, uncomplicated: Secondary | ICD-10-CM | POA: Insufficient documentation

## 2016-08-24 DIAGNOSIS — M79671 Pain in right foot: Secondary | ICD-10-CM | POA: Diagnosis not present

## 2016-08-24 DIAGNOSIS — E119 Type 2 diabetes mellitus without complications: Secondary | ICD-10-CM | POA: Diagnosis not present

## 2016-08-24 DIAGNOSIS — J449 Chronic obstructive pulmonary disease, unspecified: Secondary | ICD-10-CM | POA: Diagnosis not present

## 2016-08-24 DIAGNOSIS — M79672 Pain in left foot: Secondary | ICD-10-CM | POA: Insufficient documentation

## 2016-08-24 LAB — CBC
HCT: 36.1 % — ABNORMAL LOW (ref 39.0–52.0)
HEMOGLOBIN: 11.6 g/dL — AB (ref 13.0–17.0)
MCH: 28.9 pg (ref 26.0–34.0)
MCHC: 32.1 g/dL (ref 30.0–36.0)
MCV: 90 fL (ref 78.0–100.0)
Platelets: 237 10*3/uL (ref 150–400)
RBC: 4.01 MIL/uL — ABNORMAL LOW (ref 4.22–5.81)
RDW: 14.7 % (ref 11.5–15.5)
WBC: 7.3 10*3/uL (ref 4.0–10.5)

## 2016-08-24 LAB — BASIC METABOLIC PANEL
Anion gap: 8 (ref 5–15)
BUN: 11 mg/dL (ref 6–20)
CALCIUM: 8.3 mg/dL — AB (ref 8.9–10.3)
CO2: 23 mmol/L (ref 22–32)
CREATININE: 0.85 mg/dL (ref 0.61–1.24)
Chloride: 110 mmol/L (ref 101–111)
GFR calc Af Amer: >60 mL/min (ref >60)
Glucose, Bld: 88 mg/dL (ref 65–99)
Potassium: 3.3 mmol/L — ABNORMAL LOW (ref 3.5–5.1)
Sodium: 141 mmol/L (ref 135–145)

## 2016-08-24 LAB — I-STAT TROPONIN, ED: TROPONIN I, POC: 0 ng/mL (ref 0.00–0.08)

## 2016-08-24 NOTE — Discharge Instructions (Signed)
° °  HOMELESS SHELTERS  [facility]     [facility]   [address], [place]     [phone]              [facility] (women and children)       [address] [place], [place] [address] [phone] [email or web address] for application and process Application Required  [facility]   [address]    [place] [address]     [phone]                    [facility] [address] [place], [place] [address] [phone](schedule application appt.) Application Required  [facility] (women only)    [address]     [place], [place] [address]     [phone]      Intake starts 6pm daily Need valid ID, SSC, & Police report [facility] [place] [address] [place], [place] [phone] Application Required  [facility] (men only)     [address]      [place], [place]     [phone]       [facility] (Pregnant women only) [address] [place], [place] [phone]  [facility]      [address]      [place], [place] [address]     [phone]             [facility] [address] [place], [place] [phone] 90 day commitment/SA/Application process  [facility](men only)     [address]     [place], [place]     [phone]       Check-in at [facility]7pm            [facility] [address] [place], [place] [address] [phone] Men/Women/Women and Children must be there by 7 pm  [facility] [place], [place] [phone]

## 2016-08-24 NOTE — ED Notes (Signed)
Patient transported to X-ray 

## 2016-08-24 NOTE — ED Provider Notes (Signed)
Emergency Department Provider Note   I have reviewed the triage vital signs and the nursing notes.   HISTORY  Chief Complaint Chest Pain   HPI Cody Ortiz is a 61 y.o. male with PMH of asthma, COPD, DM, HTN, and report of prior MI presents to the emergency department for evaluation of chest pain that worsened last night. The patient reports chest pain for the last 2 years. No provoking factors and the chest pain that worsened last night. He describes it as central to slightly left sided. No productive cough or fever. Patient also complaining of pain in the feet. He is currently homeless and living on the streets. He reports not taking advantage of local homeless shelters. He is from the [place], [place] area where he apparently owns a home but states he doesn't want to live there. He apparently has friends in [place], [place].   Past Medical History:  Diagnosis Date  . Asthma   . COPD (chronic obstructive pulmonary disease) (HCC)   . Diabetes mellitus without complication (HCC)   . Hypertension   . Myocardial infarction [facility]     Patient Active Problem List   Diagnosis Date Noted  . Chest pain 06/02/2015  . COPD exacerbation (HCC) 06/02/2015  . Essential hypertension 06/02/2015  . Hypokalemia 06/02/2015    Past Surgical History:  Procedure Laterality Date  . BACK SURGERY    . NECK SURGERY      Current Outpatient Rx  . Order #: [id number] Class: Print  . Order #: [id number] Class: Print    Allergies Patient has no known allergies.  Family History  Problem Relation Age of Onset  . Heart attack Father 84  . Stroke Father     Social History Social History  Substance Use Topics  . Smoking status: Current Every Day Smoker    Packs/day: 0.50    Types: Cigarettes  . Smokeless tobacco: Never Used     Comment: Heavy Smoker  . Alcohol use Yes     Comment: "very little"    Review of Systems  Constitutional: No fever/chills Eyes: No visual changes. ENT: No sore  throat. Cardiovascular: Positive chest pain. Respiratory: Denies shortness of breath. Gastrointestinal: No abdominal pain.  No nausea, no vomiting.  No diarrhea. Positive constipation. Genitourinary: Negative for dysuria. Musculoskeletal: Negative for back pain. Positive bilateral foot pain.  Skin: Negative for rash. Neurological: Negative for headaches, focal weakness or numbness.  10-point ROS otherwise negative.  ____________________________________________   PHYSICAL EXAM:  VITAL SIGNS: Vitals:   08/24/16 1845 08/24/16 1915  BP: 120/65   Pulse:  (!) 54  Resp:  16  SpO2:  100%     Constitutional: Alert and oriented. Disheveled.  Eyes: Conjunctivae are normal.  Head: Atraumatic. Nose: No congestion/rhinnorhea. Mouth/Throat: Mucous membranes are moist.  Neck: No stridor.  Cardiovascular: Normal rate, regular rhythm. Good peripheral circulation. Grossly normal heart sounds.   Respiratory: Normal respiratory effort.  No retractions. Lungs with faint bilateral wheezing.  Gastrointestinal: Soft and nontender. No distention.  Musculoskeletal: No lower extremity tenderness nor edema. No gross deformities of extremities. Neurologic:  Normal speech and language. No gross focal neurologic deficits are appreciated.  Skin:  Skin is warm, dry and intact. No rash noted.  ____________________________________________   LABS (all labs ordered are listed, but only abnormal results are displayed)  Labs Reviewed  BASIC METABOLIC PANEL - Abnormal; Notable for the following:       Result Value   Potassium 3.3 (*)    Calcium 8.3 (*)  All other components within normal limits  CBC - Abnormal; Notable for the following:    RBC 4.01 (*)    Hemoglobin 11.6 (*)    HCT 36.1 (*)    All other components within normal limits  I-STAT TROPONIN, ED   ____________________________________________  EKG   EKG Interpretation  Date/Time:  Sunday August 24 2016 18:08:37 EDT Ventricular  Rate:  51 PR Interval:    QRS Duration: 98 QT Interval:  442 QTC Calculation: 408 R Axis:   67 Text Interpretation:  Sinus rhythm Left ventricular hypertrophy Anterior infarct, old No STEMI.  Confirmed by Alona Bene [phone]) on 08/24/2016 6:12:39 PM Also confirmed by Alona Bene [phone]), editor Elita Quick [phone])  on 08/25/2016 6:55:57 AM       ____________________________________________  RADIOLOGY  CXR:   EXAM: CHEST 2 VIEW  COMPARISON: August 10, 2016.  FINDINGS: There is no edema or consolidation. The heart size and pulmonary vascularity are normal. No adenopathy. Aorta is mildly ectatic with aortic atherosclerosis. There is an old healed fracture of the lateral left sixth rib. There is also an old healed fracture of the mid sternum. Bones are osteoporotic. There is postoperative change in the lower cervical region.  IMPRESSION: No edema or consolidation. Aorta mildly ectatic with aortic atherosclerosis.  Aortic Atherosclerosis (ICD10-I70.0).   Electronically Signed By: Bretta Bang III M.D. On: 08/24/2016 19:09 ____________________________________________   PROCEDURES  Procedure(s) performed:   Procedures  None ____________________________________________   INITIAL IMPRESSION / ASSESSMENT AND PLAN / ED COURSE  Pertinent labs & imaging results that were available during my care of the patient were reviewed by me and considered in my medical decision making (see chart for details).  Patient presents to the emergency department for evaluation of chest pain. Pain has been constant since last night. Troponin, labs, EKG, chest x-ray unremarkable. Patient has had multiple ED presentations for similar symptoms in the last month. I provided medical screening exam and will check to see if social work is available to discuss shelter options and other area resources. Will [facility] to [facility] for medication assistance. Provided  meal in the ED.   At this time, I do not feel there is any life-threatening condition present. I have reviewed and discussed all results (EKG, imaging, lab, urine as appropriate), exam findings with patient. I have reviewed nursing notes and appropriate previous records.  I feel the patient is safe to be discharged home without further emergent workup. Discussed usual and customary return precautions. Patient and family (if present) verbalize understanding and are comfortable with this plan.  Patient will follow-up with their primary care provider. If they do not have a primary care provider, information for follow-up has been provided to them. All questions have been answered.   ____________________________________________  FINAL CLINICAL IMPRESSION(S) / ED DIAGNOSES  Final diagnoses:  Precordial chest pain     MEDICATIONS GIVEN DURING THIS VISIT:  Medications - No data to display   NEW OUTPATIENT MEDICATIONS STARTED DURING THIS VISIT:  None   Note:  This document was prepared using Dragon voice recognition software and may include unintentional dictation errors.  Alona Bene, MD Emergency Medicine    Isami Mehra, Arlyss Repress, MD 08/25/16 [phone]

## 2016-08-24 NOTE — ED Triage Notes (Signed)
Per EMS pt from home having CP all day, 3 hrs ago 324mg  aspirin and 1 nitro without relief, refused another nitro, pain across chest 9/10, sharp. Pt is SOB has h/s of COPD. Pt given 4mg  zofran in route

## 2018-04-21 IMAGING — CT CT HEAD W/O CM
3 of 4 series · 14 of 47 positions shown, 16 images · non-contrast
Comparison: 07/10/2016

CLINICAL DATA: Chest pain, bilateral arm numbness

EXAM:
CT HEAD WITHOUT CONTRAST
TECHNIQUE: Contiguous axial images were obtained from the base of the skull
through the vertex without intravenous contrast.

[Series 5: cor soft · coronal · 0.31mm/px · 3 of 63 slices shown]
[im 21/63  brain]
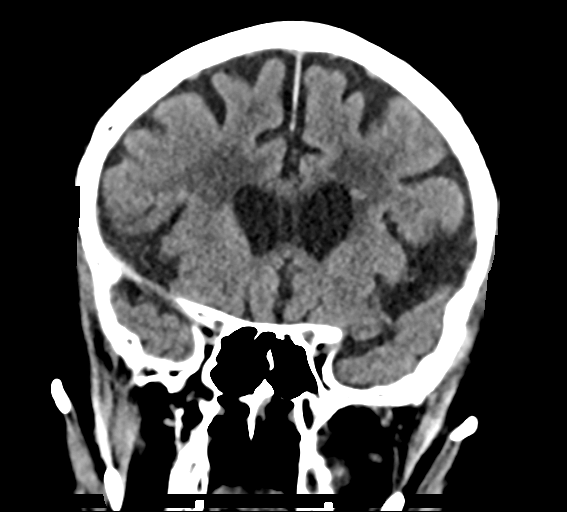
[im 28/63  brain]
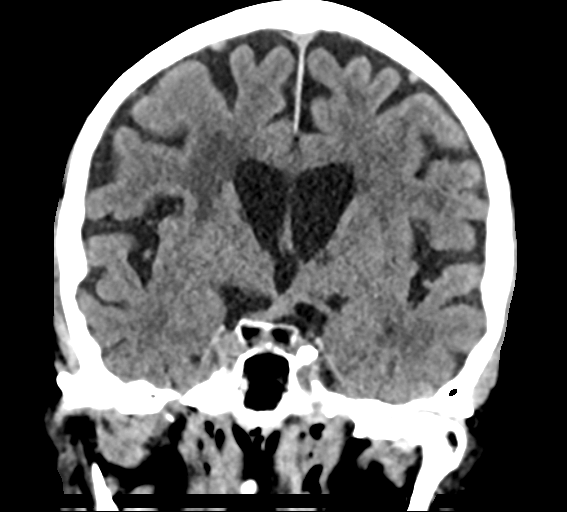
[im 35/63  brain]
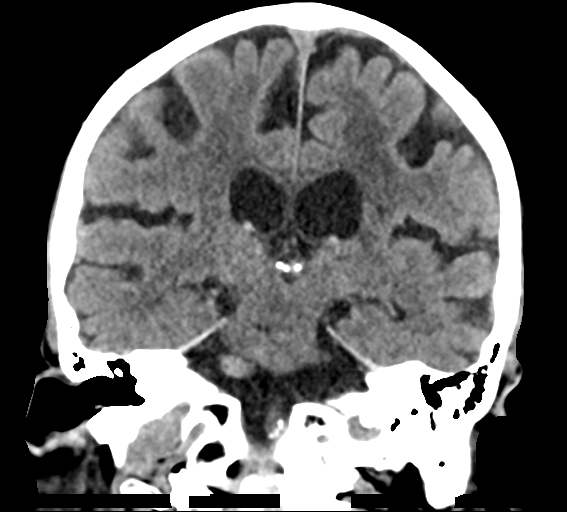

[Series 6: sag soft · sagittal · 0.31mm/px · 3 of 49 slices shown]
[im 17/49  brain]
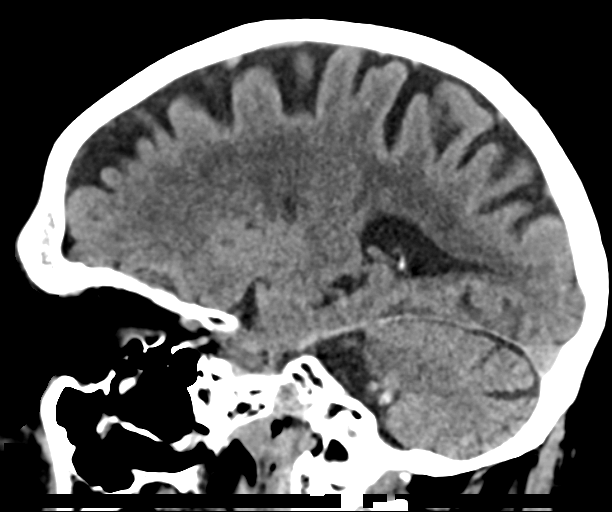
[im 25/49  brain]
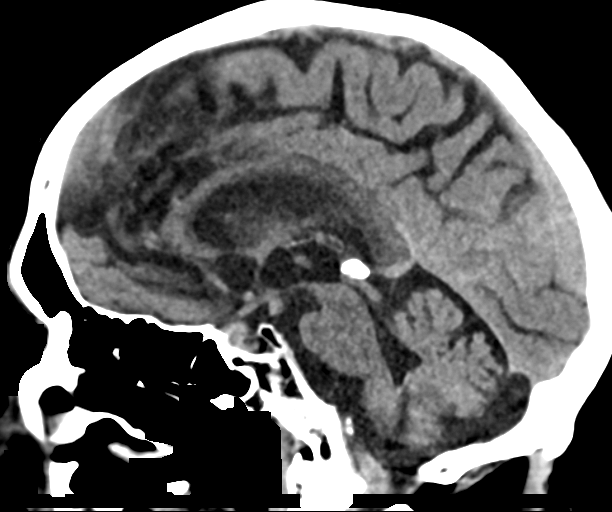
[im 33/49  brain]
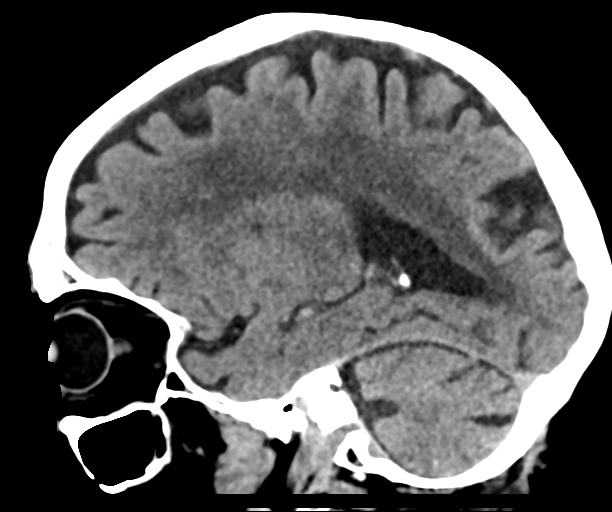

[Series 7: recon axial soft · axial · 0.29mm/px · z∈[-111,+11]mm · 8 of 53 slices shown, 10 images]
[im 6/53  brain]
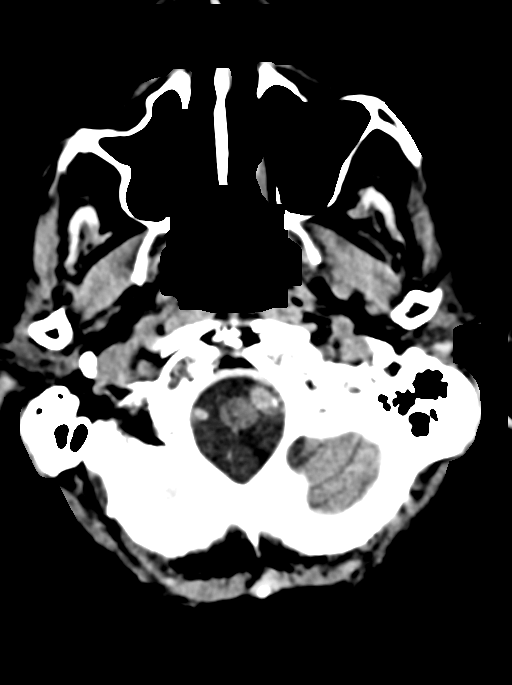
[im 6/53  bone]
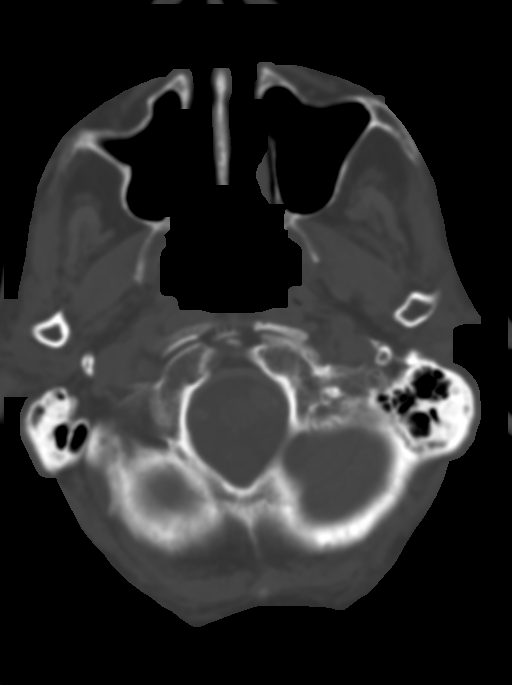
[im 12/53  brain]
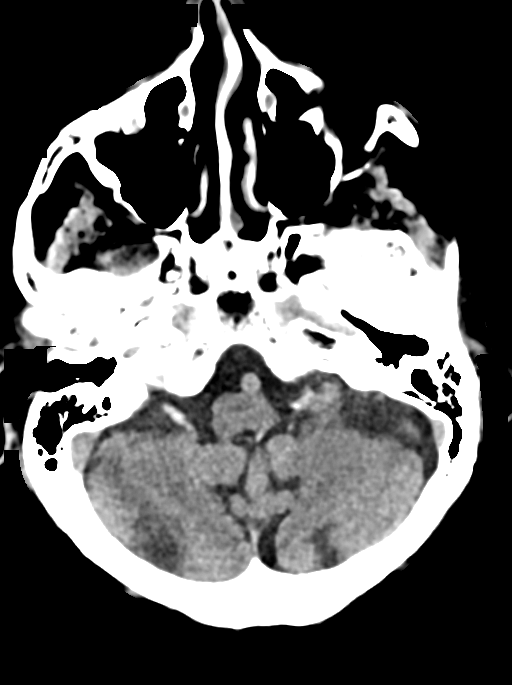
[im 18/53  brain]
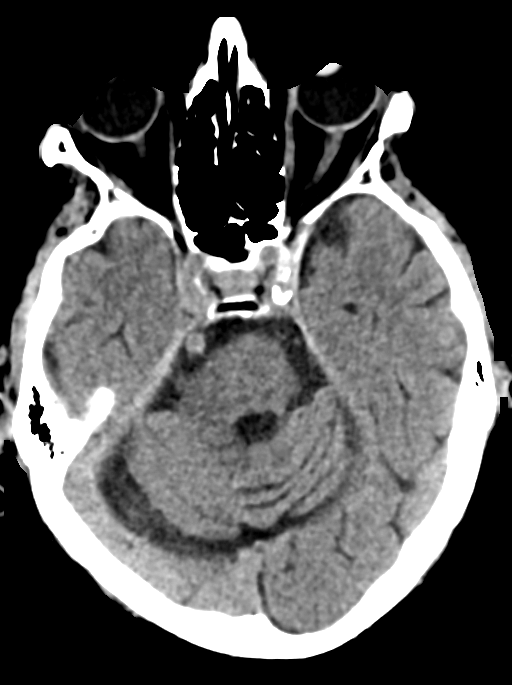
[im 24/53  brain]
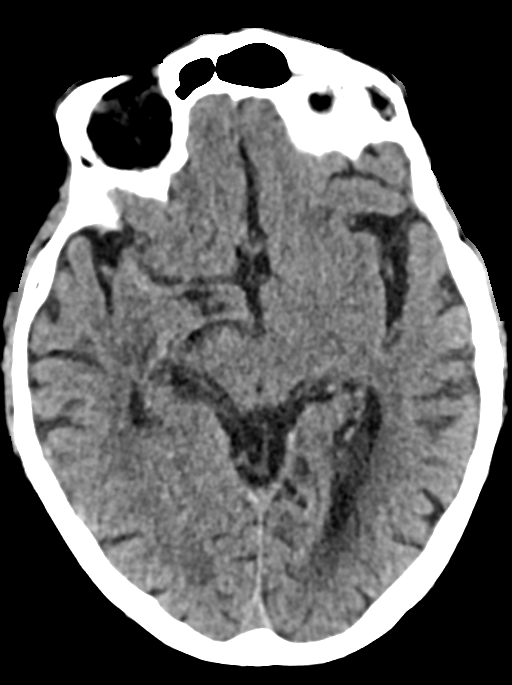
[im 29/53  brain]
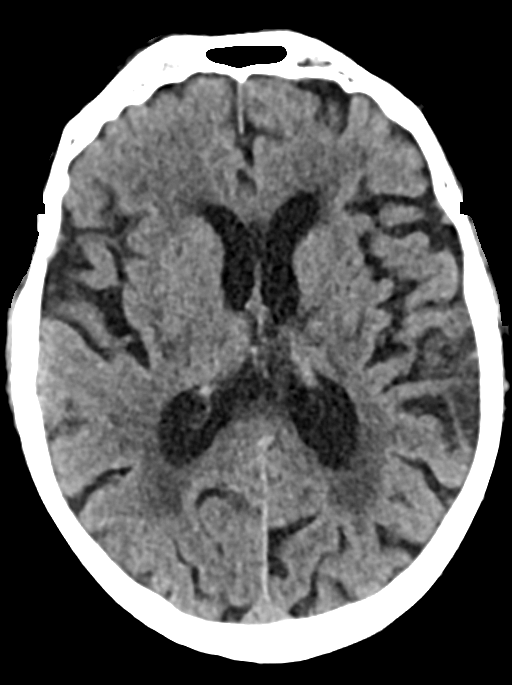
[im 29/53  bone]
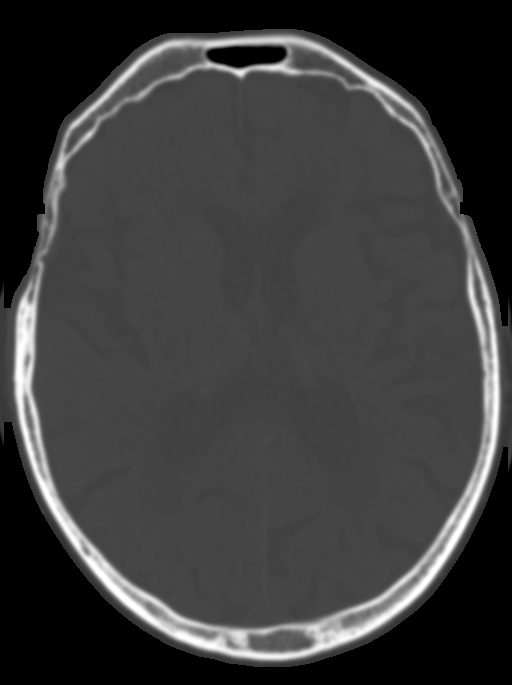
[im 35/53  brain]
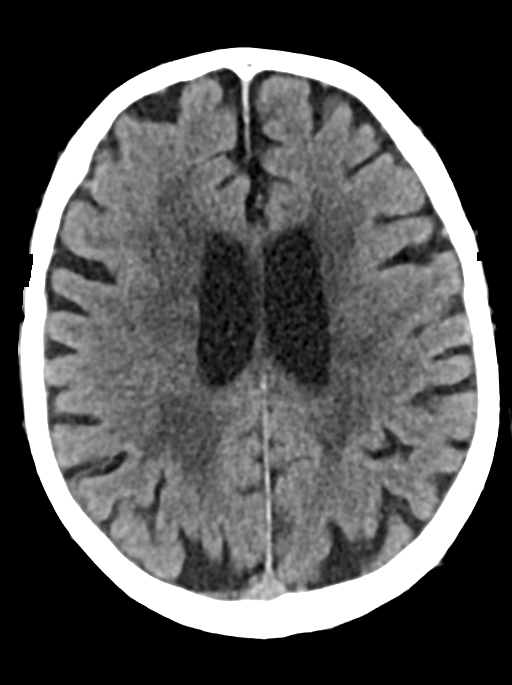
[im 41/53  brain]
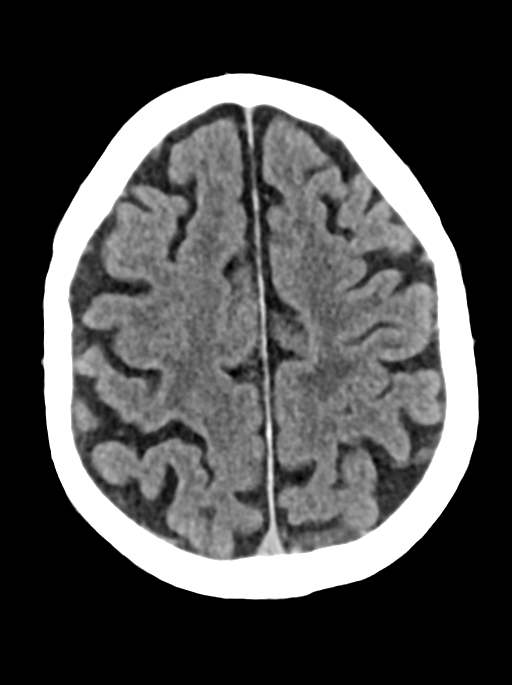
[im 47/53  brain]
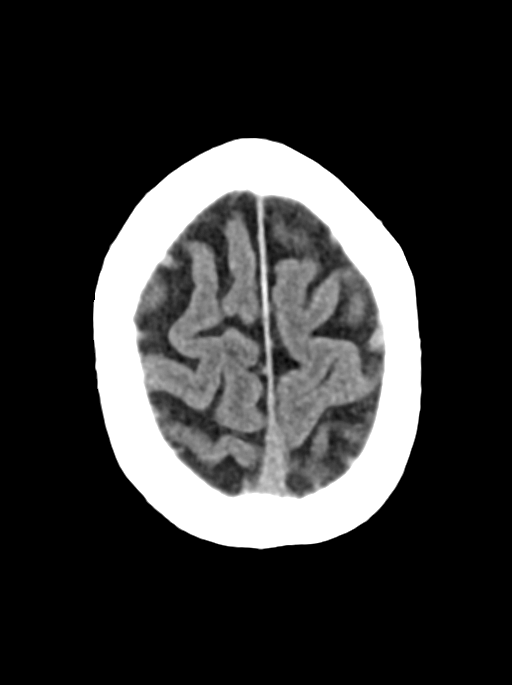

[14 of 47 positions shown; findings below may reference images not displayed]

FINDINGS: Brain: No acute intracranial hemorrhage. No focal mass lesion. No CT
evidence of acute infarction. No midline shift or mass effect. No
hydrocephalus. Basilar cisterns are patent.

There are periventricular and subcortical white matter
hypodensities. Generalized cortical atrophy.

Vascular: No hyperdense vessel or unexpected calcification.

Skull: Normal. Negative for fracture or focal lesion.

Sinuses/Orbits: Paranasal sinuses and mastoid air cells are clear.
Orbits are clear.

Other: None.
IMPRESSION: 1. No acute intracranial findings.
2. Moderate to severe atrophy and white matter microvascular disease
(for age).

## 2018-05-05 IMAGING — DX DG CHEST 2V
2 series · 2 of 2 positions shown · non-contrast
Comparison: August 10, 2016.

CLINICAL DATA: Chest pain and shortness of breath

EXAM:
CHEST  2 VIEW

[chest pa]
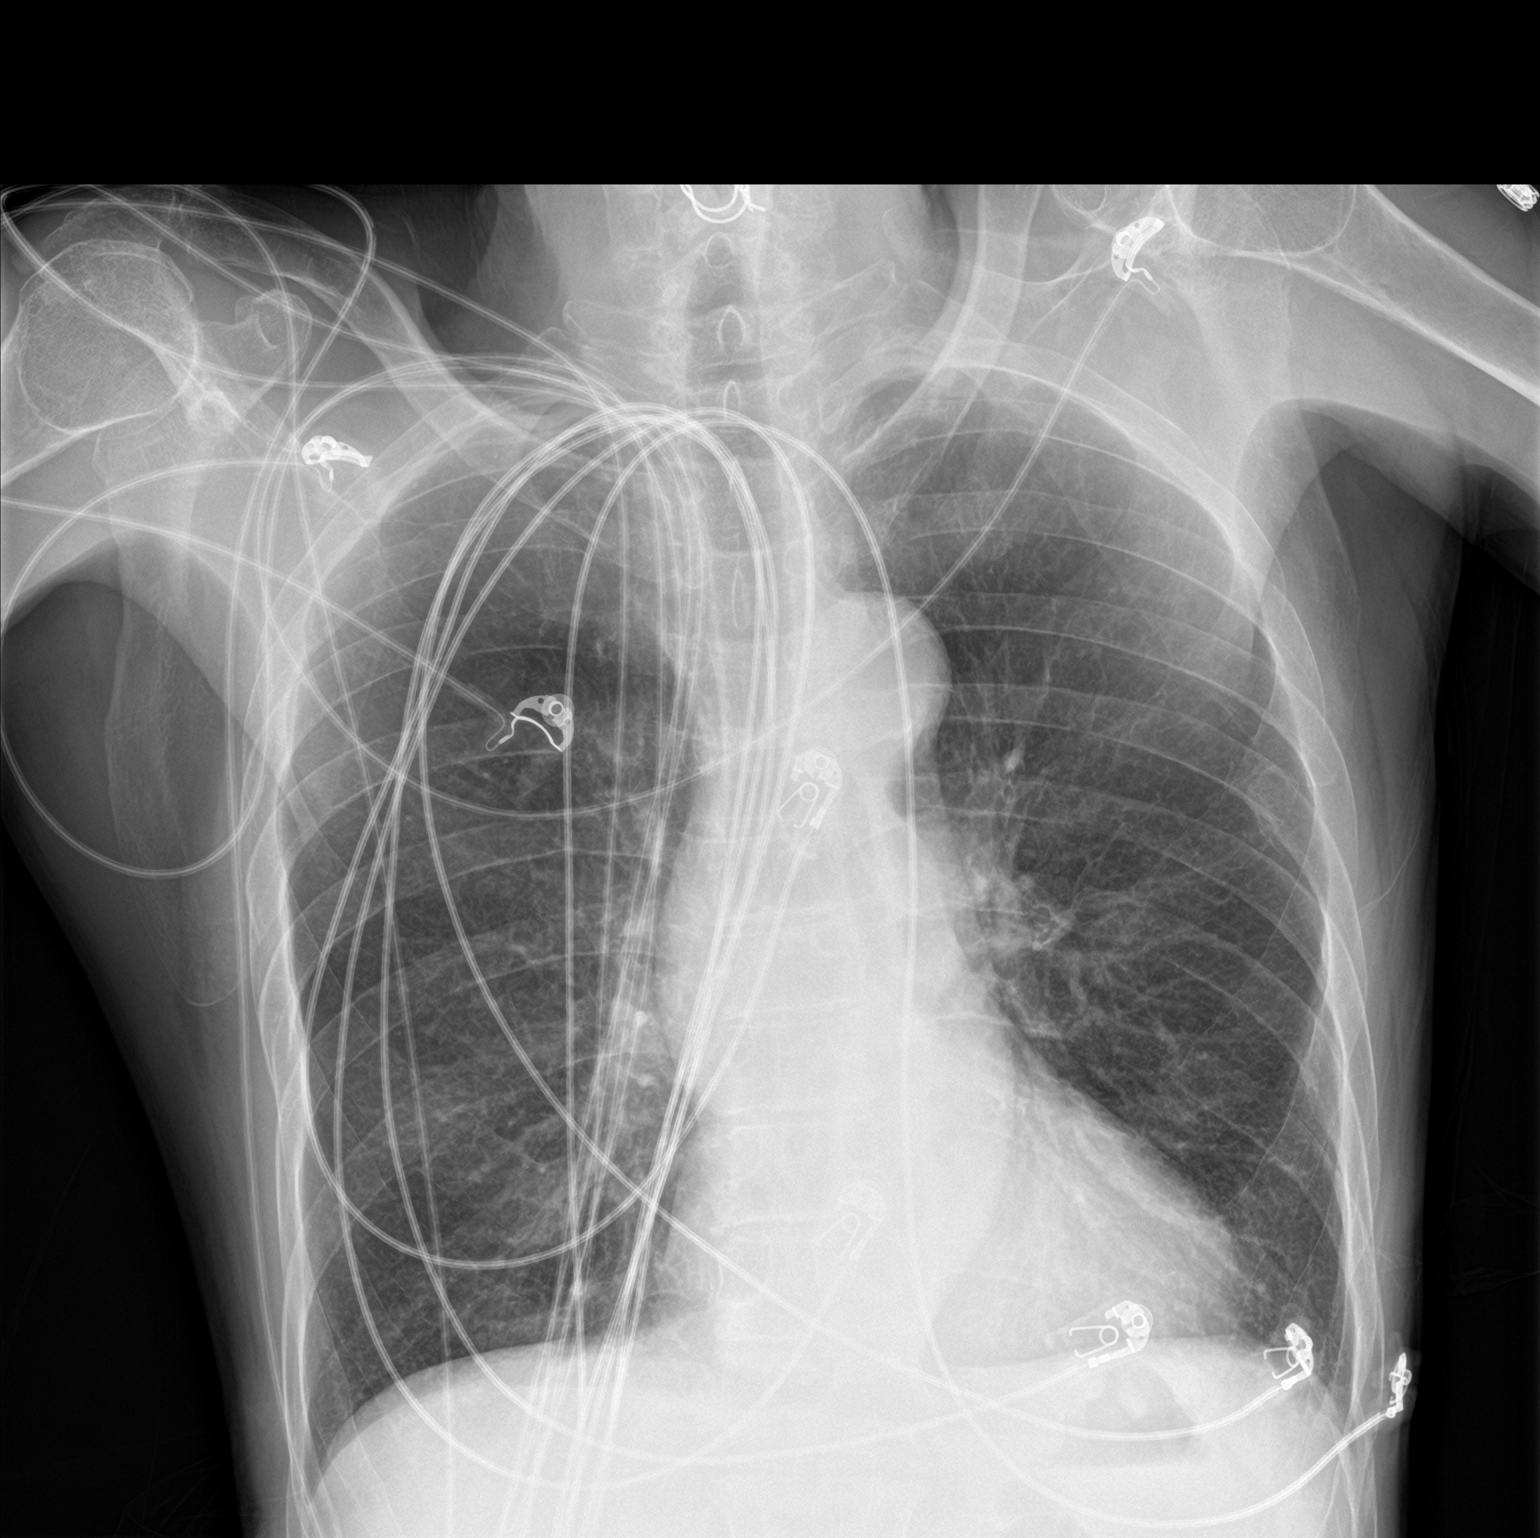

[chest lat]
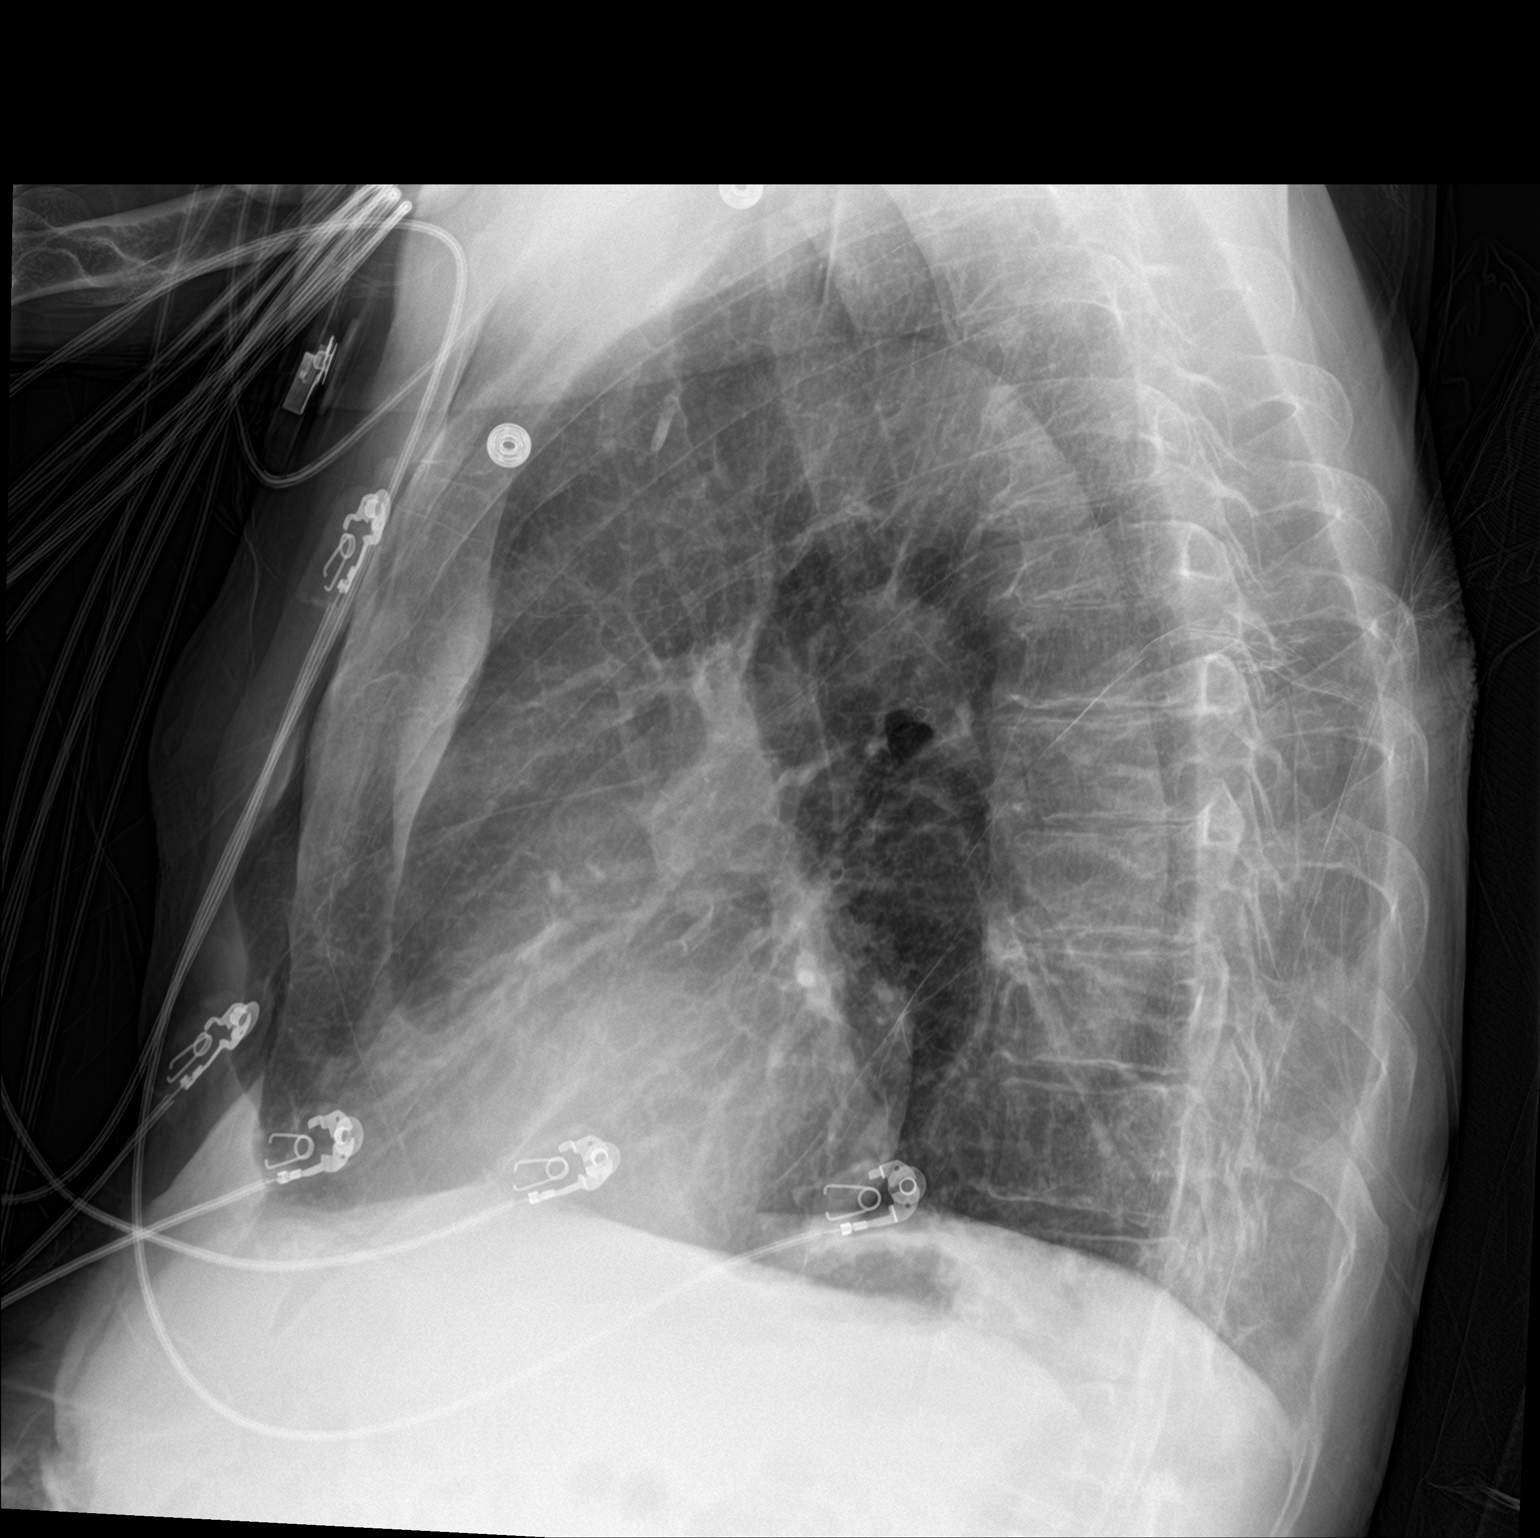

[2 of 2 positions shown; findings below may reference images not displayed]

FINDINGS: There is no edema or consolidation. The heart size and pulmonary
vascularity are normal. No adenopathy. Aorta is mildly ectatic with
aortic atherosclerosis. There is an old healed fracture of the
lateral left sixth rib. There is also an old healed fracture of the
mid sternum. Bones are osteoporotic. There is postoperative change
in the lower cervical region.
IMPRESSION: No edema or consolidation. Aorta mildly ectatic with aortic
atherosclerosis.

Aortic Atherosclerosis (ECEGK-FO0.0).
